# Patient Record
Sex: Female | Born: 1960
Health system: Southern US, Community
[De-identification: ages and names within clinical notes are randomized; demographics above are authoritative.]

## PROBLEM LIST (undated history)

## (undated) DIAGNOSIS — F329 Major depressive disorder, single episode, unspecified: Secondary | ICD-10-CM

## (undated) DIAGNOSIS — F32A Depression, unspecified: Secondary | ICD-10-CM

## (undated) DIAGNOSIS — F419 Anxiety disorder, unspecified: Secondary | ICD-10-CM

## (undated) DIAGNOSIS — E785 Hyperlipidemia, unspecified: Secondary | ICD-10-CM

## (undated) DIAGNOSIS — I10 Essential (primary) hypertension: Secondary | ICD-10-CM

## (undated) HISTORY — DX: Essential (primary) hypertension: I10

## (undated) HISTORY — DX: Major depressive disorder, single episode, unspecified: F32.9

## (undated) HISTORY — DX: Anxiety disorder, unspecified: F41.9

## (undated) HISTORY — DX: Depression, unspecified: F32.A

## (undated) HISTORY — DX: Hyperlipidemia, unspecified: E78.5

---

## 2009-12-25 ENCOUNTER — Ambulatory Visit: Payer: Self-pay | Admitting: Otolaryngology

## 2011-04-22 IMAGING — CT CT MAXILLOFACIAL WITHOUT CONTRAST
1 series · 15 of 30 positions shown, 19 images · non-contrast
Comparison: none

REASON FOR EXAM: left nasal mass maxillary sinusitis
COMMENTS:

[Series 3: (person_name) 1mm · axial · 0.31mm/px · z∈[-116,+11]mm · 15 of 137 slices shown, 19 images]
[im 5/137  brain]
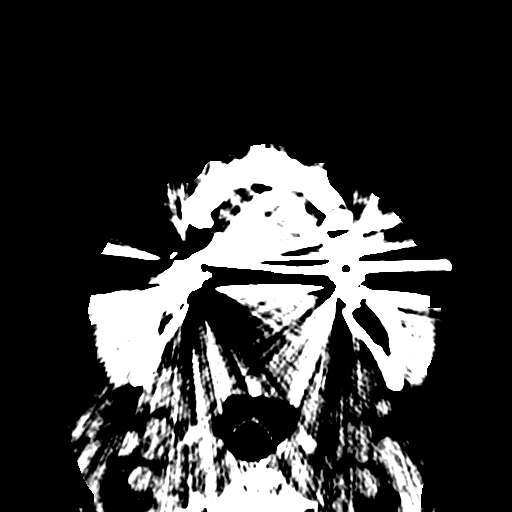
[im 5/137  bone]
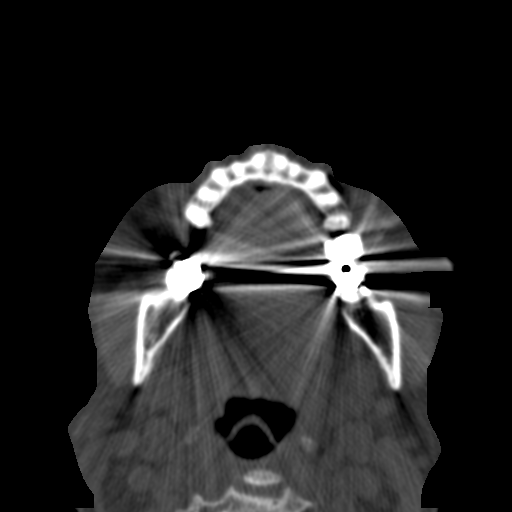
[im 15/137  bone]
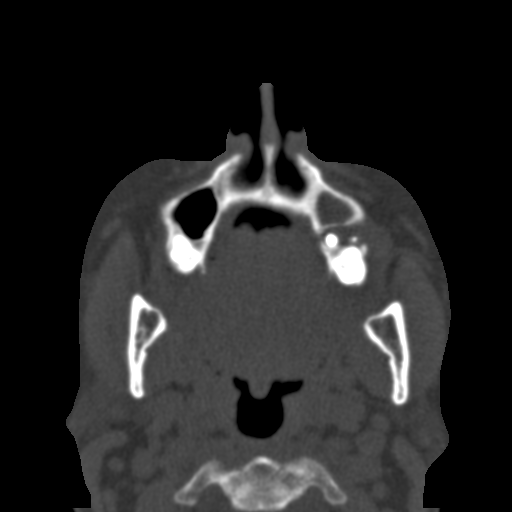
[im 24/137  bone]
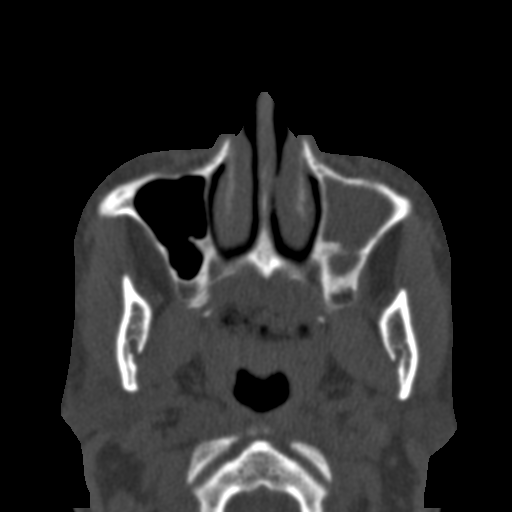
[im 33/137  bone]
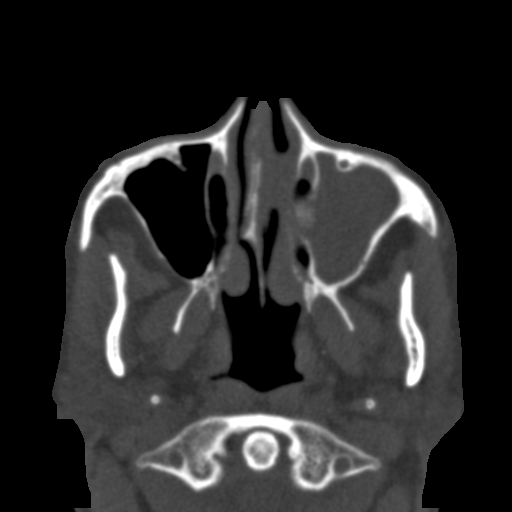
[im 43/137  brain]
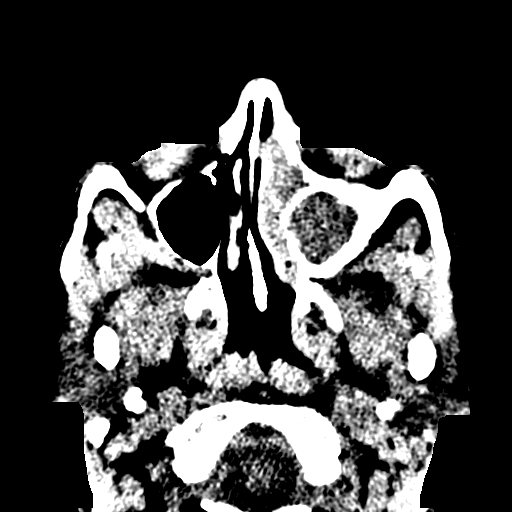
[im 43/137  bone]
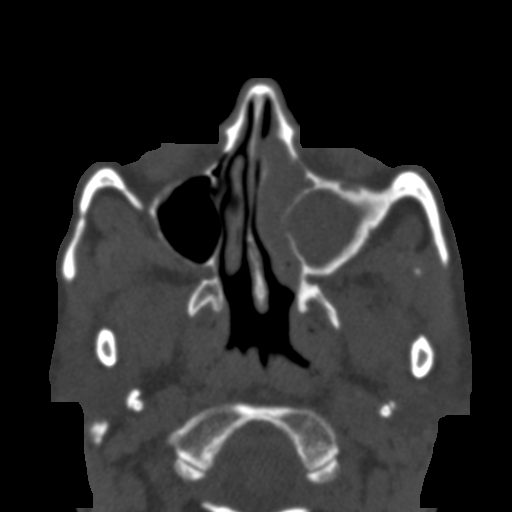
[im 52/137  bone]
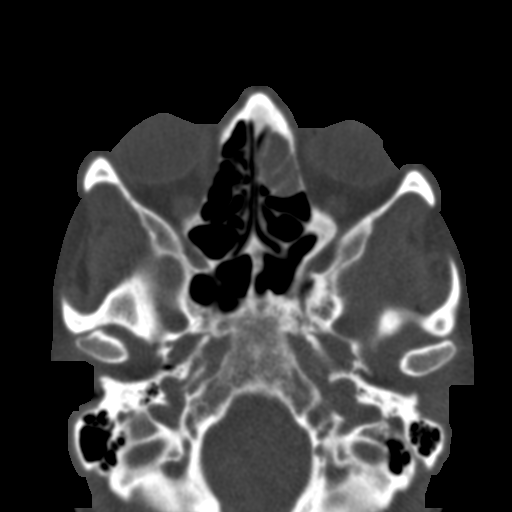
[im 61/137  bone]
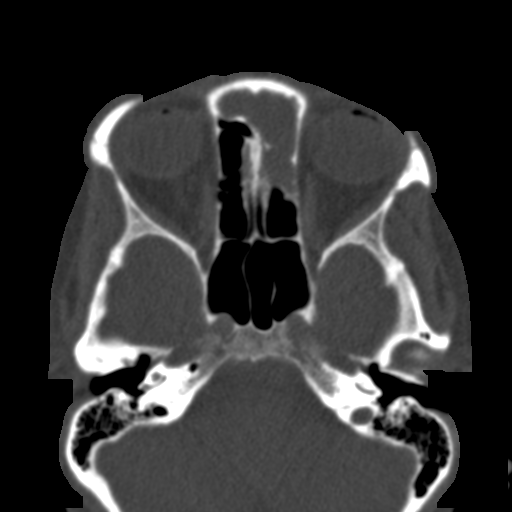
[im 71/137  bone]
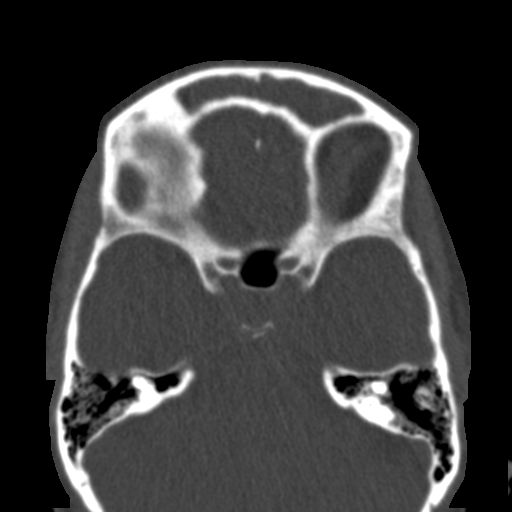
[im 76/137  brain]
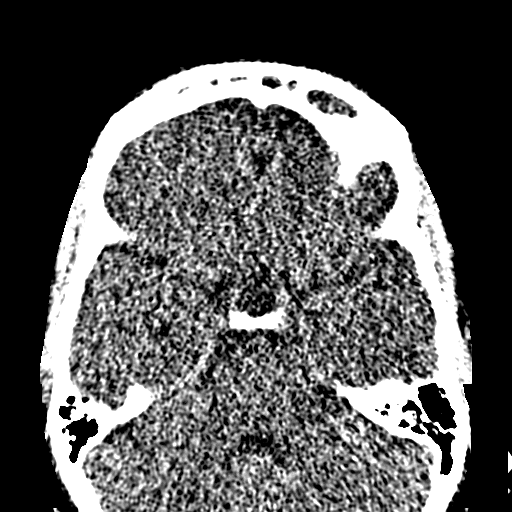
[im 76/137  bone]
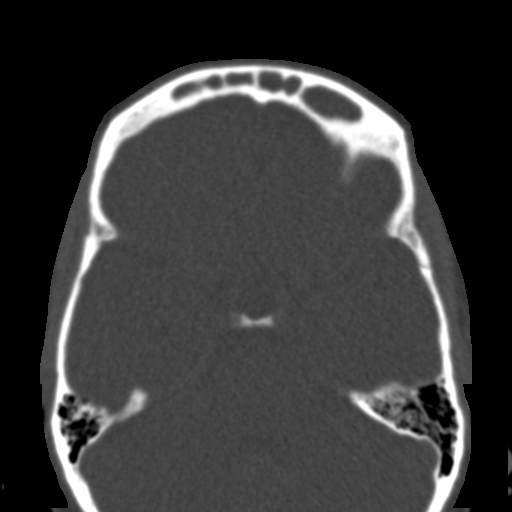
[im 85/137  bone]
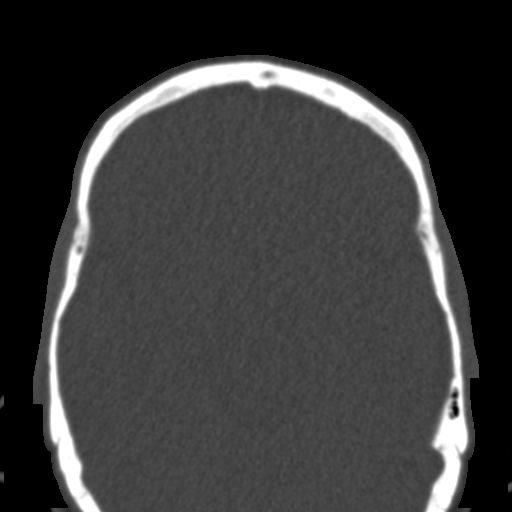
[im 94/137  bone]
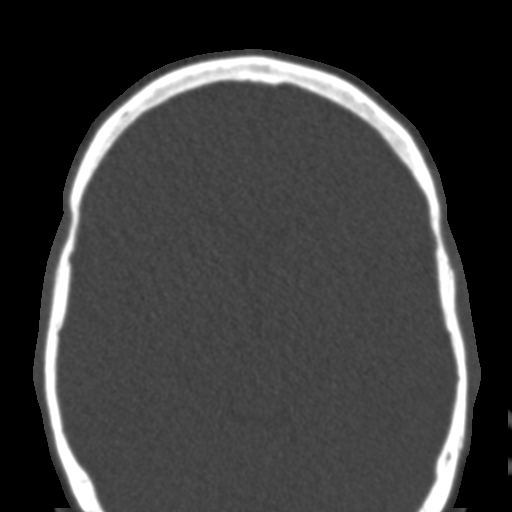
[im 104/137  bone]
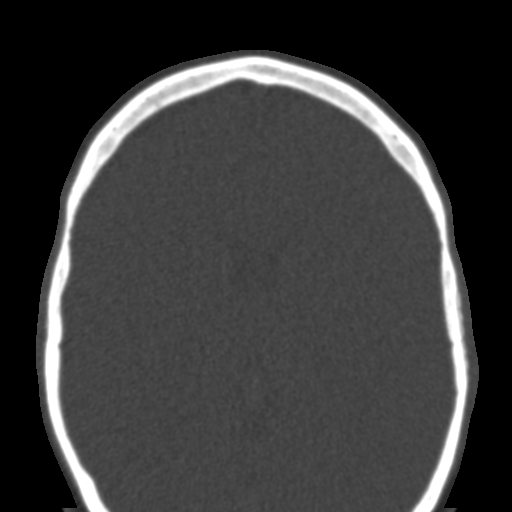
[im 113/137  brain]
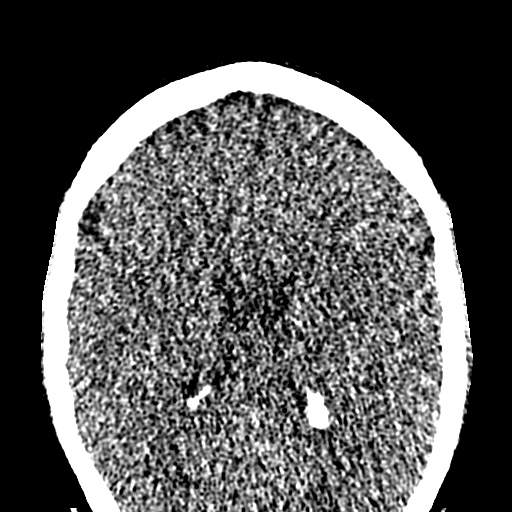
[im 113/137  bone]
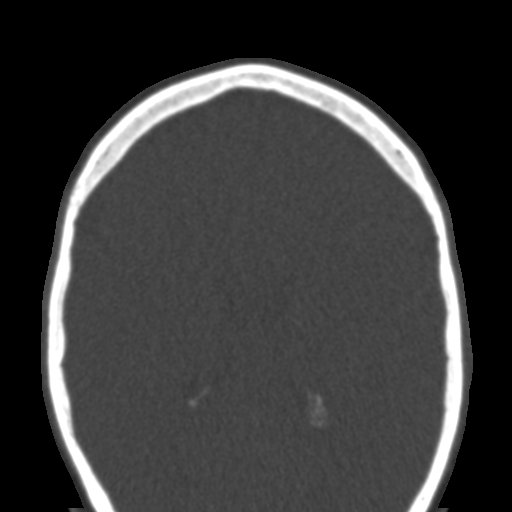
[im 122/137  bone]
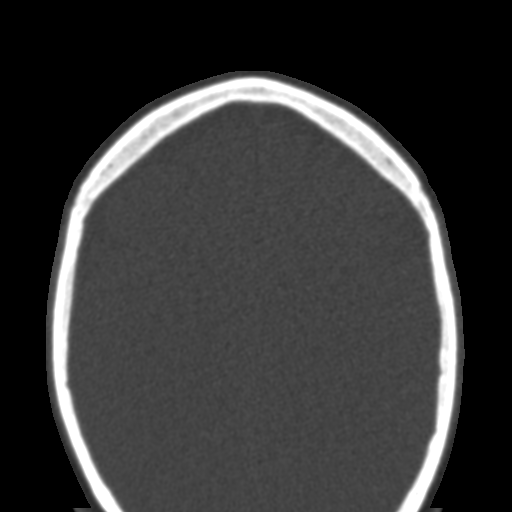
[im 132/137  bone]
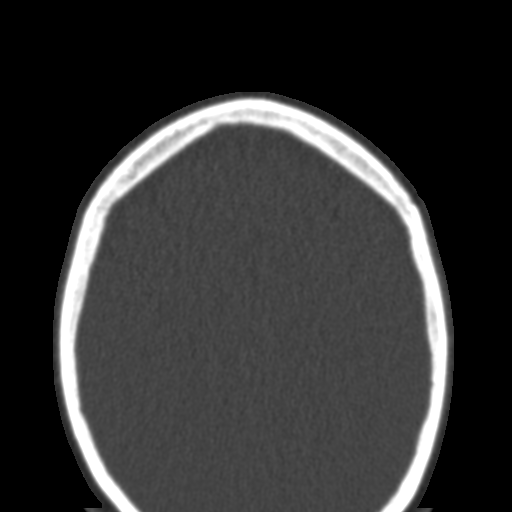

[15 of 30 positions shown; findings below may reference images not displayed]

PROCEDURE:     KCT - KCT MAXILLOFACIAL(SKOLJKE)  - December 25, 2009  [DATE]

RESULT:     Axial images were obtained through the paranasal sinuses from
the maxillary region to the anterior cranial fossa. A bone algorithm was
employed. Coronal reconstructions were obtained.

The frontal sinuses are largely opacified. There is opacification of
portions of the anterior left ethmoid sinus cells. The right ethmoid sinus
cells appear clear. The right maxillary sinus is opacified. Expansile
remodeling of the medial wall of the right maxillary sinus is seen with
displacement of the turbinate bones medially toward the nasal septum. The
nasal septum itself is mildly deviated toward the right. The sphenoid
sinuses are clear. The ostiomeatal unit of the right maxillary sinus is
patent but it is expanded and filled with soft tissue density material on
the left.
IMPRESSION: There is abnormal soft tissue density throughout the left
maxillary sinus, through the anterior and mid portions of the left ethmoid
sinus, and in the frontal sinuses with only minimal sparing of a small
portion of the right frontal sinus. The sphenoid sinuses are clear.

## 2014-07-26 DIAGNOSIS — N184 Chronic kidney disease, stage 4 (severe): Secondary | ICD-10-CM | POA: Insufficient documentation

## 2014-07-26 DIAGNOSIS — I1310 Hypertensive heart and chronic kidney disease without heart failure, with stage 1 through stage 4 chronic kidney disease, or unspecified chronic kidney disease: Secondary | ICD-10-CM | POA: Insufficient documentation

## 2014-07-26 DIAGNOSIS — I129 Hypertensive chronic kidney disease with stage 1 through stage 4 chronic kidney disease, or unspecified chronic kidney disease: Secondary | ICD-10-CM

## 2014-07-26 DIAGNOSIS — E785 Hyperlipidemia, unspecified: Secondary | ICD-10-CM

## 2014-07-26 DIAGNOSIS — N183 Chronic kidney disease, stage 3 unspecified: Secondary | ICD-10-CM

## 2014-07-26 DIAGNOSIS — I10 Essential (primary) hypertension: Secondary | ICD-10-CM

## 2014-09-07 DIAGNOSIS — F418 Other specified anxiety disorders: Secondary | ICD-10-CM | POA: Insufficient documentation

## 2014-09-13 ENCOUNTER — Encounter: Payer: Self-pay | Admitting: Unknown Physician Specialty

## 2014-09-13 ENCOUNTER — Ambulatory Visit (INDEPENDENT_AMBULATORY_CARE_PROVIDER_SITE_OTHER): Payer: 59 | Admitting: Unknown Physician Specialty

## 2014-09-13 VITALS — BP 143/84 | HR 52 | Ht 63.0 in | Wt 163.8 lb

## 2014-09-13 DIAGNOSIS — N184 Chronic kidney disease, stage 4 (severe): Secondary | ICD-10-CM | POA: Diagnosis not present

## 2014-09-13 DIAGNOSIS — I129 Hypertensive chronic kidney disease with stage 1 through stage 4 chronic kidney disease, or unspecified chronic kidney disease: Secondary | ICD-10-CM | POA: Diagnosis not present

## 2014-09-13 DIAGNOSIS — N181 Chronic kidney disease, stage 1: Secondary | ICD-10-CM | POA: Diagnosis not present

## 2014-09-13 DIAGNOSIS — N182 Chronic kidney disease, stage 2 (mild): Secondary | ICD-10-CM | POA: Diagnosis not present

## 2014-09-13 DIAGNOSIS — N189 Chronic kidney disease, unspecified: Secondary | ICD-10-CM | POA: Diagnosis not present

## 2014-09-13 DIAGNOSIS — N183 Chronic kidney disease, stage 3 unspecified: Secondary | ICD-10-CM

## 2014-09-13 DIAGNOSIS — N185 Chronic kidney disease, stage 5: Secondary | ICD-10-CM | POA: Diagnosis not present

## 2014-09-13 DIAGNOSIS — E785 Hyperlipidemia, unspecified: Secondary | ICD-10-CM

## 2014-09-13 LAB — HM COLONOSCOPY

## 2014-09-13 MED ORDER — METOPROLOL TARTRATE 100 MG PO TABS: 100.0000 mg | ORAL_TABLET | Freq: Two times a day (BID) | ORAL | Status: DC

## 2014-09-13 MED ORDER — NIFEDIPINE ER 90 MG PO TB24: 90.0000 mg | ORAL_TABLET | Freq: Every day | ORAL | Status: DC

## 2014-09-13 NOTE — Progress Notes (Signed)
BP 143/84 mmHg  Pulse 52  Ht 5\' 3"  (1.6 m)  Wt 163 lb 12.8 oz (74.299 kg)  BMI 29.02 kg/m2  SpO2 99%  LMP  (LMP Unknown)   Subjective:    Patient ID: Emma Sullivan, female    DOB: Aug 21, 1960, 54 y.o.   MRN: [id number]  HPI: Emma Sullivan is a 54 y.o. female  Chief Complaint  Patient presents with  . Hypertension    Relevant past medical, surgical, family and social history reviewed and updated as indicated. Interim medical history since our last visit reviewed. Allergies and medications reviewed and updated.  Hypertension This is a chronic problem. The problem is unchanged. The problem is controlled. Pertinent negatives include no anxiety, blurred vision, chest pain, headaches, malaise/fatigue, neck pain, orthopnea, palpitations, peripheral edema, PND or shortness of breath. There are no compliance problems.  Hypertensive end-organ damage includes kidney disease. Identifiable causes of hypertension include chronic renal disease. Sees Nephrology.  Hyperlipidemia This is a chronic problem. The problem is controlled. Exacerbating diseases include chronic renal disease. Pertinent negatives include no chest pain or shortness of breath. Current antihyperlipidemic treatment includes statins. There are no compliance problems.  Risk factors for coronary artery disease include hypertension.     Review of Systems  Constitutional: Negative for malaise/fatigue.  Eyes: Negative for blurred vision.  Respiratory: Negative for shortness of breath.   Cardiovascular: Negative for chest pain, palpitations, orthopnea and PND.  Musculoskeletal: Negative for neck pain.  Neurological: Negative for headaches.    Per HPI unless specifically indicated above     Objective:    BP 143/84 mmHg  Pulse 52  Ht 5\' 3"  (1.6 m)  Wt 163 lb 12.8 oz (74.299 kg)  BMI 29.02 kg/m2  SpO2 99%  LMP  (LMP Unknown)  Wt Readings from Last 3 Encounters:  09/13/14 163 lb 12.8 oz (74.299 kg)  03/11/14 162 lb  (73.483 kg)    Physical Exam  Constitutional: She is oriented to person, place, and time. She appears well-developed and well-nourished. No distress.  HENT:  Head: Normocephalic and atraumatic.  Eyes: Conjunctivae and lids are normal. Right eye exhibits no discharge. Left eye exhibits no discharge. No scleral icterus.  Cardiovascular: Normal rate and regular rhythm.   Pulmonary/Chest: Effort normal and breath sounds normal. No respiratory distress.  Abdominal: Normal appearance. There is no splenomegaly or hepatomegaly.  Musculoskeletal: Normal range of motion.  Neurological: She is alert and oriented to person, place, and time.  Skin: Skin is intact. No rash noted. No pallor.  Psychiatric: She has a normal mood and affect. Her behavior is normal. Judgment and thought content normal.    Results for orders placed or performed in visit on 09/13/14  HM COLONOSCOPY  Result Value Ref Range   HM Colonoscopy refused       Assessment & Plan:   Problem List Items Addressed This Visit      Genitourinary   Hypertensive nephropathy    A little high today.  However, it has been stable.  Will recheck in 6 months      Relevant Orders   Comprehensive metabolic panel   Chronic kidney disease, stage III (moderate) - Primary    Per Nephrology      Relevant Orders   Comprehensive metabolic panel     Other   Hyperlipemia    Check Lipid panel today      Relevant Medications   metoprolol (LOPRESSOR) 100 MG tablet   NIFEdipine (ADALAT CC) 90 MG 24 hr  tablet   Other Relevant Orders   Comprehensive metabolic panel   Lipid Panel w/o Chol/HDL Ratio       Follow up plan: Return in about 6 months (around 03/15/2015) for PHYSICAL.

## 2014-09-13 NOTE — Assessment & Plan Note (Signed)
A little high today.  However, it has been stable.  Will recheck in 6 months

## 2014-09-13 NOTE — Assessment & Plan Note (Signed)
Per Nephrology.

## 2014-09-13 NOTE — Assessment & Plan Note (Signed)
Check Lipid panel today 

## 2014-09-14 LAB — COMPREHENSIVE METABOLIC PANEL
ALBUMIN: 4.4 g/dL (ref 3.5–5.5)
ALT: 13 IU/L (ref 0–32)
AST: 20 IU/L (ref 0–40)
Albumin/Globulin Ratio: 2.4 (ref 1.1–2.5)
Alkaline Phosphatase: 106 IU/L (ref 39–117)
BUN / CREAT RATIO: 19 (ref 9–23)
BUN: 36 mg/dL — AB (ref 6–24)
Bilirubin Total: 0.5 mg/dL (ref 0.0–1.2)
CALCIUM: 9.7 mg/dL (ref 8.7–10.2)
CHLORIDE: 106 mmol/L (ref 97–108)
CO2: 20 mmol/L (ref 18–29)
CREATININE: 1.9 mg/dL — AB (ref 0.57–1.00)
GFR calc Af Amer: 33 mL/min/{1.73_m2} — ABNORMAL LOW (ref >59)
GFR calc non Af Amer: 29 mL/min/{1.73_m2} — ABNORMAL LOW (ref >59)
GLUCOSE: 91 mg/dL (ref 65–99)
Globulin, Total: 1.8 g/dL (ref 1.5–4.5)
Potassium: 4.8 mmol/L (ref 3.5–5.2)
Sodium: 141 mmol/L (ref 134–144)
Total Protein: 6.2 g/dL (ref 6.0–8.5)

## 2014-09-14 LAB — LIPID PANEL W/O CHOL/HDL RATIO
CHOLESTEROL TOTAL: 157 mg/dL (ref 100–199)
HDL: 49 mg/dL (ref >39)
LDL Calculated: 96 mg/dL (ref 0–99)
Triglycerides: 62 mg/dL (ref 0–149)
VLDL Cholesterol Cal: 12 mg/dL (ref 5–40)

## 2014-11-04 ENCOUNTER — Telehealth: Payer: Self-pay | Admitting: Unknown Physician Specialty

## 2014-11-04 DIAGNOSIS — N183 Chronic kidney disease, stage 3 unspecified: Secondary | ICD-10-CM

## 2014-11-04 NOTE — Telephone Encounter (Signed)
Pt came in and wanted to know about getting another referral to go to [facility] to see dr Johnney Ou. Says she'd been going for a while but has changed insurances and now needs a new referral. Pt would like a call back at [phone]

## 2014-11-04 NOTE — Telephone Encounter (Signed)
Routing to provider  

## 2014-11-08 NOTE — Telephone Encounter (Signed)
Is this for Nephrology?

## 2014-11-08 NOTE — Telephone Encounter (Signed)
Yes it is for nephrology at [facility].

## 2015-03-02 ENCOUNTER — Encounter: Payer: Self-pay | Admitting: Unknown Physician Specialty

## 2015-03-14 ENCOUNTER — Encounter: Payer: 59 | Admitting: Unknown Physician Specialty

## 2015-04-25 ENCOUNTER — Ambulatory Visit (INDEPENDENT_AMBULATORY_CARE_PROVIDER_SITE_OTHER): Payer: BLUE CROSS/BLUE SHIELD | Admitting: Unknown Physician Specialty

## 2015-04-25 ENCOUNTER — Encounter: Payer: Self-pay | Admitting: Unknown Physician Specialty

## 2015-04-25 VITALS — BP 137/83 | HR 52 | Temp 98.5°F | Ht 61.3 in | Wt 159.2 lb

## 2015-04-25 DIAGNOSIS — N183 Chronic kidney disease, stage 3 unspecified: Secondary | ICD-10-CM

## 2015-04-25 DIAGNOSIS — I10 Essential (primary) hypertension: Secondary | ICD-10-CM | POA: Diagnosis not present

## 2015-04-25 DIAGNOSIS — Z0000 Encounter for general adult medical examination without abnormal findings: Secondary | ICD-10-CM

## 2015-04-25 NOTE — Progress Notes (Signed)
BP 137/83 mmHg  Pulse 52  Temp(Src) 98.5 F (36.9 C)  Ht 5' 1.3" (1.557 m)  Wt 159 lb 3.2 oz (72.213 kg)  BMI 29.79 kg/m2  SpO2 98%  LMP  (LMP Unknown)   Subjective:    Patient ID: Emma Sullivan, female    DOB: 11-24-1960, 55 y.o.   MRN: [id number]  HPI: Emma Sullivan is a 55 y.o. female  Chief Complaint  Patient presents with  . Annual Exam   Hypertension Using medications without difficulty Average home BPs: Not checking  No problems or lightheadedness No chest pain with exertion or shortness of breath No Edema    Relevant past medical, surgical, family and social history reviewed and updated as indicated. Interim medical history since our last visit reviewed. Allergies and medications reviewed and updated.  Review of Systems  Constitutional: Negative.   HENT: Negative.   Eyes: Negative.   Respiratory: Negative.   Cardiovascular: Negative.   Gastrointestinal: Negative.   Endocrine: Negative.   Genitourinary: Negative.   Musculoskeletal: Negative.   Skin: Negative.   Allergic/Immunologic: Negative.   Neurological: Negative.   Hematological: Negative.   Psychiatric/Behavioral: Negative.     Per HPI unless specifically indicated above     Objective:    BP 137/83 mmHg  Pulse 52  Temp(Src) 98.5 F (36.9 C)  Ht 5' 1.3" (1.557 m)  Wt 159 lb 3.2 oz (72.213 kg)  BMI 29.79 kg/m2  SpO2 98%  LMP  (LMP Unknown)  Wt Readings from Last 3 Encounters:  04/25/15 159 lb 3.2 oz (72.213 kg)  09/13/14 163 lb 12.8 oz (74.299 kg)  03/11/14 162 lb (73.483 kg)    Physical Exam  Constitutional: She is oriented to person, place, and time. She appears well-developed and well-nourished.  HENT:  Head: Normocephalic and atraumatic.  Eyes: Pupils are equal, round, and reactive to light. Right eye exhibits no discharge. Left eye exhibits no discharge. No scleral icterus.  Neck: Normal range of motion. Neck supple. Carotid bruit is not present. No thyromegaly present.   Cardiovascular: Normal rate, regular rhythm and normal heart sounds.  Exam reveals no gallop and no friction rub.   No murmur heard. Pulmonary/Chest: Effort normal and breath sounds normal. No respiratory distress. She has no wheezes. She has no rales.  Abdominal: Soft. Bowel sounds are normal. There is no tenderness. There is no rebound.  Genitourinary: No breast swelling, tenderness or discharge.  Musculoskeletal: Normal range of motion.  Lymphadenopathy:    She has no cervical adenopathy.  Neurological: She is alert and oriented to person, place, and time.  Skin: Skin is warm, dry and intact. No rash noted.  Psychiatric: She has a normal mood and affect. Her speech is normal and behavior is normal. Judgment and thought content normal. Cognition and memory are normal.    Results for orders placed or performed in visit on 09/13/14  Comprehensive metabolic panel  Result Value Ref Range   Glucose 91 65 - 99 mg/dL   BUN 36 (H) 6 - 24 mg/dL   Creatinine, Ser 1.90 (H) 0.57 - 1.00 mg/dL   GFR calc non Af Amer 29 (L) >59 mL/min/1.73   GFR calc Af Amer 33 (L) >59 mL/min/1.73   BUN/Creatinine Ratio 19 9 - 23   Sodium 141 134 - 144 mmol/L   Potassium 4.8 3.5 - 5.2 mmol/L   Chloride 106 97 - 108 mmol/L   CO2 20 18 - 29 mmol/L   Calcium 9.7 8.7 - 10.2 mg/dL  Total Protein 6.2 6.0 - 8.5 g/dL   Albumin 4.4 3.5 - 5.5 g/dL   Globulin, Total 1.8 1.5 - 4.5 g/dL   Albumin/Globulin Ratio 2.4 1.1 - 2.5   Bilirubin Total 0.5 0.0 - 1.2 mg/dL   Alkaline Phosphatase 106 39 - 117 IU/L   AST 20 0 - 40 IU/L   ALT 13 0 - 32 IU/L  Lipid Panel w/o Chol/HDL Ratio  Result Value Ref Range   Cholesterol, Total 157 100 - 199 mg/dL   Triglycerides 62 0 - 149 mg/dL   HDL 49 >39 mg/dL   VLDL Cholesterol Cal 12 5 - 40 mg/dL   LDL Calculated 96 0 - 99 mg/dL  HM COLONOSCOPY  Result Value Ref Range   HM Colonoscopy refused    CMP and CBC done at nephrology and are stable    Assessment & Plan:   Problem  List Items Addressed This Visit      Unprioritized   Essential hypertension, benign    Stable, continue present medications.        Relevant Medications   metoprolol (LOPRESSOR) 100 MG tablet   Chronic kidney disease, stage III (moderate) - Primary    Per Nephrology      Relevant Orders   VITAMIN D 25 Hydroxy (Vit-D Deficiency, Fractures)    Other Visit Diagnoses    Routine general medical examination at a health care facility        Relevant Orders    TSH    HIV antibody    Hepatitis C antibody        Follow up plan: Return in about 6 months (around 10/23/2015).

## 2015-04-25 NOTE — Assessment & Plan Note (Signed)
Per Nephrology.

## 2015-04-25 NOTE — Assessment & Plan Note (Signed)
Stable, continue present medications.   

## 2015-04-26 ENCOUNTER — Encounter: Payer: Self-pay | Admitting: Unknown Physician Specialty

## 2015-04-26 LAB — TSH: TSH: 2.65 u[IU]/mL (ref 0.450–4.500)

## 2015-04-26 LAB — HIV ANTIBODY (ROUTINE TESTING W REFLEX): HIV SCREEN 4TH GENERATION: NONREACTIVE

## 2015-04-26 LAB — HEPATITIS C ANTIBODY: Hep C Virus Ab: <0.1 s/co ratio (ref 0.0–0.9)

## 2015-04-26 LAB — VITAMIN D 25 HYDROXY (VIT D DEFICIENCY, FRACTURES): Vit D, 25-Hydroxy: 31.5 ng/mL (ref 30.0–100.0)

## 2015-05-08 ENCOUNTER — Other Ambulatory Visit: Payer: Self-pay | Admitting: Unknown Physician Specialty

## 2015-09-18 ENCOUNTER — Other Ambulatory Visit: Payer: Self-pay | Admitting: Unknown Physician Specialty

## 2015-10-24 ENCOUNTER — Ambulatory Visit (INDEPENDENT_AMBULATORY_CARE_PROVIDER_SITE_OTHER): Payer: BLUE CROSS/BLUE SHIELD | Admitting: Unknown Physician Specialty

## 2015-10-24 ENCOUNTER — Encounter: Payer: Self-pay | Admitting: Unknown Physician Specialty

## 2015-10-24 VITALS — BP 114/74 | HR 66 | Temp 98.4°F | Ht 63.0 in | Wt 160.0 lb

## 2015-10-24 DIAGNOSIS — N183 Chronic kidney disease, stage 3 unspecified: Secondary | ICD-10-CM

## 2015-10-24 DIAGNOSIS — I10 Essential (primary) hypertension: Secondary | ICD-10-CM

## 2015-10-24 DIAGNOSIS — E785 Hyperlipidemia, unspecified: Secondary | ICD-10-CM

## 2015-10-24 MED ORDER — NIFEDIPINE ER OSMOTIC RELEASE 90 MG PO TB24: 90.0000 mg | ORAL_TABLET | Freq: Every day | ORAL | 1 refills | Status: DC

## 2015-10-24 MED ORDER — METOPROLOL TARTRATE 100 MG PO TABS: 100.0000 mg | ORAL_TABLET | Freq: Two times a day (BID) | ORAL | 1 refills | Status: DC

## 2015-10-24 MED ORDER — PRAVASTATIN SODIUM 10 MG PO TABS: 10.0000 mg | ORAL_TABLET | Freq: Every day | ORAL | 1 refills | Status: DC

## 2015-10-24 NOTE — Progress Notes (Signed)
BP 114/74 (BP Location: Left Arm, Patient Position: Sitting, Cuff Size: Large)   Pulse 66   Temp 98.4 F (36.9 C)   Ht 5\' 3"  (1.6 m)   Wt 160 lb (72.6 kg)   LMP  (LMP Unknown)   SpO2 98%   BMI 28.34 kg/m    Subjective:    Patient ID: Emma Sullivan, female    DOB: 1960/06/08, 55 y.o.   MRN: [id number]  HPI: Emma Sullivan is a 55 y.o. female  Chief Complaint  Patient presents with  . Hyperlipidemia  . Hypertension   Hypertension Using medications without difficulty Average home BPs not checking   No problems or lightheadedness No chest pain with exertion or shortness of breath No Edema   Hyperlipidemia Using medications without problems: No Muscle aches  Diet compliance: Eats well Exercise: Plenty of exercise  CKD Through Nephrology  Relevant past medical, surgical, family and social history reviewed and updated as indicated. Interim medical history since our last visit reviewed. Allergies and medications reviewed and updated.  Review of Systems  Per HPI unless specifically indicated above     Objective:    BP 114/74 (BP Location: Left Arm, Patient Position: Sitting, Cuff Size: Large)   Pulse 66   Temp 98.4 F (36.9 C)   Ht 5\' 3"  (1.6 m)   Wt 160 lb (72.6 kg)   LMP  (LMP Unknown)   SpO2 98%   BMI 28.34 kg/m   Wt Readings from Last 3 Encounters:  10/24/15 160 lb (72.6 kg)  04/25/15 159 lb 3.2 oz (72.2 kg)  09/13/14 163 lb 12.8 oz (74.3 kg)    Physical Exam  Constitutional: She is oriented to person, place, and time. She appears well-developed and well-nourished. No distress.  HENT:  Head: Normocephalic and atraumatic.  Eyes: Conjunctivae and lids are normal. Right eye exhibits no discharge. Left eye exhibits no discharge. No scleral icterus.  Neck: Normal range of motion. Neck supple. No JVD present. Carotid bruit is not present.  Cardiovascular: Normal rate, regular rhythm and normal heart sounds.   Pulmonary/Chest: Effort normal and breath  sounds normal.  Abdominal: Normal appearance. There is no splenomegaly or hepatomegaly.  Musculoskeletal: Normal range of motion.  Neurological: She is alert and oriented to person, place, and time.  Skin: Skin is warm, dry and intact. No rash noted. No pallor.  Psychiatric: She has a normal mood and affect. Her behavior is normal. Judgment and thought content normal.    Results for orders placed or performed in visit on 04/25/15  TSH  Result Value Ref Range   TSH 2.650 0.450 - 4.500 uIU/mL  HIV antibody  Result Value Ref Range   HIV Screen 4th Generation wRfx Non Reactive Non Reactive  Hepatitis C antibody  Result Value Ref Range   Hep C Virus Ab <0.1 0.0 - 0.9 s/co ratio  VITAMIN D 25 Hydroxy (Vit-D Deficiency, Fractures)  Result Value Ref Range   Vit D, 25-Hydroxy 31.5 30.0 - 100.0 ng/mL      Assessment & Plan:   Problem List Items Addressed This Visit      Unprioritized   Chronic kidney disease, stage III (moderate)   Essential hypertension, benign - Primary   Relevant Medications   pravastatin (PRAVACHOL) 10 MG tablet   NIFEdipine (PROCARDIA XL/ADALAT-CC) 90 MG 24 hr tablet   metoprolol (LOPRESSOR) 100 MG tablet   Other Relevant Orders   Comprehensive metabolic panel   Hyperlipemia   Relevant Medications   pravastatin (PRAVACHOL)  10 MG tablet   NIFEdipine (PROCARDIA XL/ADALAT-CC) 90 MG 24 hr tablet   metoprolol (LOPRESSOR) 100 MG tablet   Other Relevant Orders   Lipid Panel w/o Chol/HDL Ratio    Other Visit Diagnoses   None.      Follow up plan: Return in about 6 months (around 04/25/2016) for physical.

## 2015-10-25 ENCOUNTER — Encounter: Payer: Self-pay | Admitting: Unknown Physician Specialty

## 2015-10-25 LAB — COMPREHENSIVE METABOLIC PANEL
ALK PHOS: 108 IU/L (ref 39–117)
ALT: 13 IU/L (ref 0–32)
AST: 22 IU/L (ref 0–40)
Albumin/Globulin Ratio: 1.6 (ref 1.2–2.2)
Albumin: 4.2 g/dL (ref 3.5–5.5)
BILIRUBIN TOTAL: 0.4 mg/dL (ref 0.0–1.2)
BUN/Creatinine Ratio: 16 (ref 9–23)
BUN: 34 mg/dL — AB (ref 6–24)
CHLORIDE: 105 mmol/L (ref 96–106)
CO2: 23 mmol/L (ref 18–29)
Calcium: 9.5 mg/dL (ref 8.7–10.2)
Creatinine, Ser: 2.12 mg/dL — ABNORMAL HIGH (ref 0.57–1.00)
GFR calc Af Amer: 29 mL/min/{1.73_m2} — ABNORMAL LOW (ref >59)
GFR calc non Af Amer: 25 mL/min/{1.73_m2} — ABNORMAL LOW (ref >59)
GLUCOSE: 84 mg/dL (ref 65–99)
Globulin, Total: 2.6 g/dL (ref 1.5–4.5)
Potassium: 4.6 mmol/L (ref 3.5–5.2)
Sodium: 142 mmol/L (ref 134–144)
Total Protein: 6.8 g/dL (ref 6.0–8.5)

## 2015-10-25 LAB — LIPID PANEL W/O CHOL/HDL RATIO
CHOLESTEROL TOTAL: 181 mg/dL (ref 100–199)
HDL: 50 mg/dL (ref >39)
LDL Calculated: 108 mg/dL — ABNORMAL HIGH (ref 0–99)
TRIGLYCERIDES: 113 mg/dL (ref 0–149)
VLDL CHOLESTEROL CAL: 23 mg/dL (ref 5–40)

## 2016-04-30 ENCOUNTER — Encounter: Payer: BLUE CROSS/BLUE SHIELD | Admitting: Unknown Physician Specialty

## 2016-05-15 ENCOUNTER — Encounter: Payer: Self-pay | Admitting: Unknown Physician Specialty

## 2016-05-15 ENCOUNTER — Ambulatory Visit (INDEPENDENT_AMBULATORY_CARE_PROVIDER_SITE_OTHER): Payer: BLUE CROSS/BLUE SHIELD | Admitting: Unknown Physician Specialty

## 2016-05-15 VITALS — BP 118/75 | HR 67 | Temp 98.7°F | Ht 61.2 in | Wt 164.1 lb

## 2016-05-15 DIAGNOSIS — E7800 Pure hypercholesterolemia, unspecified: Secondary | ICD-10-CM | POA: Diagnosis not present

## 2016-05-15 DIAGNOSIS — N184 Chronic kidney disease, stage 4 (severe): Secondary | ICD-10-CM

## 2016-05-15 DIAGNOSIS — I10 Essential (primary) hypertension: Secondary | ICD-10-CM | POA: Diagnosis not present

## 2016-05-15 DIAGNOSIS — Z0000 Encounter for general adult medical examination without abnormal findings: Secondary | ICD-10-CM

## 2016-05-15 MED ORDER — PRAVASTATIN SODIUM 10 MG PO TABS: 10.0000 mg | ORAL_TABLET | Freq: Every day | ORAL | 1 refills | Status: DC

## 2016-05-15 MED ORDER — METOPROLOL TARTRATE 100 MG PO TABS: 100.0000 mg | ORAL_TABLET | Freq: Two times a day (BID) | ORAL | 1 refills | Status: DC

## 2016-05-15 MED ORDER — NIFEDIPINE ER OSMOTIC RELEASE 90 MG PO TB24: 90.0000 mg | ORAL_TABLET | Freq: Every day | ORAL | 1 refills | Status: DC

## 2016-05-15 NOTE — Progress Notes (Signed)
BP 118/75 (BP Location: Left Arm, Patient Position: Sitting, Cuff Size: Normal)   Pulse 67   Temp 98.7 F (37.1 C)   Ht 5' 1.2" (1.554 m)   Wt 164 lb 1.6 oz (74.4 kg)   LMP  (LMP Unknown)   SpO2 97%   BMI 30.80 kg/m    Subjective:    Patient ID: Emma Sullivan, female    DOB: 06-29-1960, 56 y.o.   MRN: [id number]  HPI: Emma Sullivan is a 56 y.o. female  Chief Complaint  Patient presents with  . Annual Exam   Not answering Medicare exam questions.    Hypertension Using medications without difficulty Average home BP Not checking  No problems or lightheadedness No chest pain with exertion or shortness of breath No Edema   Hyperlipidemia Using medications without problems: No Muscle aches  Diet compliance: varies Exercise: Works at  [employer]  . Marital status: Married    Spouse name: N/A  . Number of children: N/A  . Years of education: N/A   Occupational History  . Not on file.   Social History Main Topics  . Smoking status: Never Smoker  . Smokeless tobacco: Never Used  . Alcohol use No  . Drug use: No  . Sexual activity: Yes   Other Topics Concern  . Not on file   Social History Narrative  . No narrative on file   Family History  Problem Relation Age of Onset  . Diabetes Mother   . Hypertension Mother   . Heart disease Father   . Hypertension Daughter    Past Medical History:  Diagnosis Date  . Anxiety   . Depression   . Hyperlipidemia   . Hypertension    Past Surgical History:  Procedure Laterality Date  . CESAREAN SECTION       Relevant past medical, surgical, family and social history reviewed and updated as indicated. Interim medical history since our last visit reviewed. Allergies and medications reviewed and updated.  Review of Systems  Constitutional: Negative.   HENT: Negative.   Eyes: Negative.   Respiratory: Negative.   Cardiovascular: Negative.   Gastrointestinal: Negative.     Endocrine: Negative.   Genitourinary: Negative.   Musculoskeletal: Negative.   Skin: Negative.   Allergic/Immunologic: Negative.   Neurological: Negative.   Hematological: Negative.   Psychiatric/Behavioral: Negative.     Per HPI unless specifically indicated above     Objective:    BP 118/75 (BP Location: Left Arm, Patient Position: Sitting, Cuff Size: Normal)   Pulse 67   Temp 98.7 F (37.1 C)   Ht 5' 1.2" (1.554 m)   Wt 164 lb 1.6 oz (74.4 kg)   LMP  (LMP Unknown)   SpO2 97%   BMI 30.80 kg/m   Wt Readings from Last 3 Encounters:  05/15/16 164 lb 1.6 oz (74.4 kg)  10/24/15 160 lb (72.6 kg)  04/25/15 159 lb 3.2 oz (72.2 kg)    Physical Exam  Constitutional: She is oriented to person, place, and time. She appears well-developed and well-nourished. No distress.  HENT:  Head: Normocephalic and atraumatic.  Eyes: Conjunctivae and lids are normal. Right eye exhibits no discharge. Left eye exhibits no discharge. No scleral icterus.  Neck: Normal range of motion. Neck supple. No JVD present. Carotid bruit is not present.  Cardiovascular: Normal rate, regular rhythm and normal heart sounds.   Pulmonary/Chest: Effort normal and breath sounds normal.  Abdominal: Normal appearance. There  is no splenomegaly or hepatomegaly.  Musculoskeletal: Normal range of motion.  Neurological: She is alert and oriented to person, place, and time.  Skin: Skin is warm, dry and intact. No rash noted. No pallor.  Psychiatric: She has a normal mood and affect. Her behavior is normal. Judgment and thought content normal.    Results for orders placed or performed in visit on 10/24/15  Comprehensive metabolic panel  Result Value Ref Range   Glucose 84 65 - 99 mg/dL   BUN 34 (H) 6 - 24 mg/dL   Creatinine, Ser 2.12 (H) 0.57 - 1.00 mg/dL   GFR calc non Af Amer 25 (L) >59 mL/min/1.73   GFR calc Af Amer 29 (L) >59 mL/min/1.73   BUN/Creatinine Ratio 16 9 - 23   Sodium 142 134 - 144 mmol/L    Potassium 4.6 3.5 - 5.2 mmol/L   Chloride 105 96 - 106 mmol/L   CO2 23 18 - 29 mmol/L   Calcium 9.5 8.7 - 10.2 mg/dL   Total Protein 6.8 6.0 - 8.5 g/dL   Albumin 4.2 3.5 - 5.5 g/dL   Globulin, Total 2.6 1.5 - 4.5 g/dL   Albumin/Globulin Ratio 1.6 1.2 - 2.2   Bilirubin Total 0.4 0.0 - 1.2 mg/dL   Alkaline Phosphatase 108 39 - 117 IU/L   AST 22 0 - 40 IU/L   ALT 13 0 - 32 IU/L  Lipid Panel w/o Chol/HDL Ratio  Result Value Ref Range   Cholesterol, Total 181 100 - 199 mg/dL   Triglycerides 113 0 - 149 mg/dL   HDL 50 >39 mg/dL   VLDL Cholesterol Cal 23 5 - 40 mg/dL   LDL Calculated 108 (H) 0 - 99 mg/dL      Assessment & Plan:   Problem List Items Addressed This Visit      Unprioritized   Chronic kidney disease, stage 4 (severe) ([place])    Per nephrology      Essential hypertension, benign    Stable, continue present medications.        Hyperlipemia    Stable, continue present medications.         Other Visit Diagnoses    Annual physical exam    -  Primary       Follow up plan: Return in about 6 months (around 11/12/2016).

## 2016-05-15 NOTE — Assessment & Plan Note (Signed)
Per nephrology 

## 2016-05-15 NOTE — Assessment & Plan Note (Signed)
Stable, continue present medications.   

## 2016-08-08 ENCOUNTER — Encounter: Payer: Self-pay | Admitting: Family Medicine

## 2016-08-08 ENCOUNTER — Ambulatory Visit (INDEPENDENT_AMBULATORY_CARE_PROVIDER_SITE_OTHER): Payer: BLUE CROSS/BLUE SHIELD | Admitting: Family Medicine

## 2016-08-08 VITALS — BP 117/61 | HR 62 | Temp 98.6°F | Wt 164.2 lb

## 2016-08-08 DIAGNOSIS — R21 Rash and other nonspecific skin eruption: Secondary | ICD-10-CM | POA: Diagnosis not present

## 2016-08-08 MED ORDER — PREDNISONE 10 MG PO TABS: ORAL_TABLET | ORAL | 0 refills | Status: DC

## 2016-08-08 MED ORDER — HYDROXYZINE HCL 50 MG PO TABS: 50.0000 mg | ORAL_TABLET | Freq: Three times a day (TID) | ORAL | 0 refills | Status: DC | PRN

## 2016-08-08 MED ORDER — CLOBETASOL PROPIONATE 0.05 % EX CREA: 1.0000 "application " | TOPICAL_CREAM | Freq: Two times a day (BID) | CUTANEOUS | 1 refills | Status: DC

## 2016-08-08 NOTE — Progress Notes (Signed)
   BP 117/61 (BP Location: Right Arm, Patient Position: Sitting, Cuff Size: Normal)   Pulse 62   Temp 98.6 F (37 C)   Wt 164 lb 3.2 oz (74.5 kg)   LMP  (LMP Unknown)   SpO2 98%   BMI 30.82 kg/m    Subjective:    Patient ID: Emma Sullivan, female    DOB: December 15, 1960, 56 y.o.   MRN: [id number]  HPI: Emma Sullivan is a 56 y.o. female  Chief Complaint  Patient presents with  . Rash    x's 5 days. Chest, neck, shoulder. Chest section is painful.   Patient presents with 5 day hx of itchy blistering rash that started as a patch in her mid-chest area and has spread intermittently to shoulders, lower abdomen, and this morning a few spots came up on back of her neck. States this happens almost every spring at some point, can't pinpoint any new soaps, lotions, detergents, etc that could be causing it. Has been using cortisone cream with no relief.   Relevant past medical, surgical, family and social history reviewed and updated as indicated. Interim medical history since our last visit reviewed. Allergies and medications reviewed and updated.  Review of Systems  Constitutional: Negative.   HENT: Negative.   Eyes: Negative.   Respiratory: Negative.   Cardiovascular: Negative.   Gastrointestinal: Negative.   Musculoskeletal: Negative.   Skin: Positive for rash.  Neurological: Negative.   Psychiatric/Behavioral: Negative.    Per HPI unless specifically indicated above     Objective:    BP 117/61 (BP Location: Right Arm, Patient Position: Sitting, Cuff Size: Normal)   Pulse 62   Temp 98.6 F (37 C)   Wt 164 lb 3.2 oz (74.5 kg)   LMP  (LMP Unknown)   SpO2 98%   BMI 30.82 kg/m   Wt Readings from Last 3 Encounters:  08/08/16 164 lb 3.2 oz (74.5 kg)  05/15/16 164 lb 1.6 oz (74.4 kg)  10/24/15 160 lb (72.6 kg)    Physical Exam  Constitutional: She is oriented to person, place, and time. She appears well-developed and well-nourished. No distress.  HENT:  Head: Atraumatic.    Eyes: Conjunctivae are normal. No scleral icterus.  Neck: Normal range of motion. Neck supple.  Cardiovascular: Normal rate and normal heart sounds.   Pulmonary/Chest: Effort normal and breath sounds normal. No respiratory distress.  Musculoskeletal: Normal range of motion.  Neurological: She is alert and oriented to person, place, and time.  Skin: Skin is warm and dry. Rash (Blistered erythematous midline rash about 2 inches in diameter on chest. Smaller erythematous papular rashes sporadically over other areas) noted.  Psychiatric: She has a normal mood and affect. Her behavior is normal.  Nursing note and vitals reviewed.     Assessment & Plan:   Problem List Items Addressed This Visit    None    Visit Diagnoses    Rash    -  Primary   Suspect allergic, but unclear trigger. Hydroxyzine, prednisone taper, and clobetasol sent. Discussed precautions with medications. F/u if no improvement       Follow up plan: Return if symptoms worsen or fail to improve.

## 2016-08-08 NOTE — Patient Instructions (Signed)
Follow up if no improvement 

## 2016-11-13 ENCOUNTER — Ambulatory Visit: Payer: BLUE CROSS/BLUE SHIELD | Admitting: Unknown Physician Specialty

## 2016-12-03 ENCOUNTER — Ambulatory Visit: Payer: BLUE CROSS/BLUE SHIELD | Admitting: Unknown Physician Specialty

## 2016-12-05 ENCOUNTER — Other Ambulatory Visit: Payer: Self-pay | Admitting: Unknown Physician Specialty

## 2016-12-11 ENCOUNTER — Ambulatory Visit (INDEPENDENT_AMBULATORY_CARE_PROVIDER_SITE_OTHER): Payer: BLUE CROSS/BLUE SHIELD | Admitting: Unknown Physician Specialty

## 2016-12-11 ENCOUNTER — Encounter: Payer: Self-pay | Admitting: Unknown Physician Specialty

## 2016-12-11 DIAGNOSIS — E7800 Pure hypercholesterolemia, unspecified: Secondary | ICD-10-CM

## 2016-12-11 DIAGNOSIS — I10 Essential (primary) hypertension: Secondary | ICD-10-CM | POA: Diagnosis not present

## 2016-12-11 DIAGNOSIS — N184 Chronic kidney disease, stage 4 (severe): Secondary | ICD-10-CM

## 2016-12-11 MED ORDER — METOPROLOL TARTRATE 100 MG PO TABS: 100.0000 mg | ORAL_TABLET | Freq: Two times a day (BID) | ORAL | 1 refills | Status: DC

## 2016-12-11 MED ORDER — PRAVASTATIN SODIUM 10 MG PO TABS: 10.0000 mg | ORAL_TABLET | Freq: Every day | ORAL | 1 refills | Status: DC

## 2016-12-11 MED ORDER — NIFEDIPINE ER OSMOTIC RELEASE 90 MG PO TB24: 90.0000 mg | ORAL_TABLET | Freq: Every day | ORAL | 0 refills | Status: DC

## 2016-12-11 NOTE — Assessment & Plan Note (Signed)
Pt taking a small dose of Pravastatin.  LDL typically below 100.  Checking labs today won't change treatment

## 2016-12-11 NOTE — Assessment & Plan Note (Signed)
Stable, continue present medications.   

## 2016-12-11 NOTE — Progress Notes (Signed)
BP 120/77   Pulse (!) 58   Temp 98.6 F (37 C)   Wt 165 lb (74.8 kg)   LMP  (LMP Unknown)   SpO2 98%   BMI 30.97 kg/m    Subjective:    Patient ID: Emma Sullivan, female    DOB: 29-Mar-1960, 56 y.o.   MRN: [id number]  HPI: Emma Sullivan is a 56 y.o. female  Chief Complaint  Patient presents with  . Hyperlipidemia  . Hypertension   Hypertension Using medications without difficulty Average home BPs Not checking   No problems or lightheadedness No chest pain with exertion or shortness of breath No Edema  Hyperlipidemia Using medications without problems: No Muscle aches  Diet compliance:Exercise: No changes  CKD Stage 4.  Pt has just seen Nephrology and seeing every 6 months.  I don/t have notes and don't know what her last GFR was   Relevant past medical, surgical, family and social history reviewed and updated as indicated. Interim medical history since our last visit reviewed. Allergies and medications reviewed and updated.  Review of Systems  Per HPI unless specifically indicated above     Objective:    BP 120/77   Pulse (!) 58   Temp 98.6 F (37 C)   Wt 165 lb (74.8 kg)   LMP  (LMP Unknown)   SpO2 98%   BMI 30.97 kg/m   Wt Readings from Last 3 Encounters:  12/11/16 165 lb (74.8 kg)  08/08/16 164 lb 3.2 oz (74.5 kg)  05/15/16 164 lb 1.6 oz (74.4 kg)    Physical Exam  Constitutional: She is oriented to person, place, and time. She appears well-developed and well-nourished. No distress.  HENT:  Head: Normocephalic and atraumatic.  Eyes: Conjunctivae and lids are normal. Right eye exhibits no discharge. Left eye exhibits no discharge. No scleral icterus.  Neck: Normal range of motion. Neck supple. No JVD present. Carotid bruit is not present.  Cardiovascular: Normal rate, regular rhythm and normal heart sounds.   Pulmonary/Chest: Effort normal and breath sounds normal.  Abdominal: Normal appearance. There is no splenomegaly or hepatomegaly.    Musculoskeletal: Normal range of motion.  Neurological: She is alert and oriented to person, place, and time.  Skin: Skin is warm, dry and intact. No rash noted. No pallor.  Psychiatric: She has a normal mood and affect. Her behavior is normal. Judgment and thought content normal.    Results for orders placed or performed in visit on 10/24/15  Comprehensive metabolic panel  Result Value Ref Range   Glucose 84 65 - 99 mg/dL   BUN 34 (H) 6 - 24 mg/dL   Creatinine, Ser 2.12 (H) 0.57 - 1.00 mg/dL   GFR calc non Af Amer 25 (L) >59 mL/min/1.73   GFR calc Af Amer 29 (L) >59 mL/min/1.73   BUN/Creatinine Ratio 16 9 - 23   Sodium 142 134 - 144 mmol/L   Potassium 4.6 3.5 - 5.2 mmol/L   Chloride 105 96 - 106 mmol/L   CO2 23 18 - 29 mmol/L   Calcium 9.5 8.7 - 10.2 mg/dL   Total Protein 6.8 6.0 - 8.5 g/dL   Albumin 4.2 3.5 - 5.5 g/dL   Globulin, Total 2.6 1.5 - 4.5 g/dL   Albumin/Globulin Ratio 1.6 1.2 - 2.2   Bilirubin Total 0.4 0.0 - 1.2 mg/dL   Alkaline Phosphatase 108 39 - 117 IU/L   AST 22 0 - 40 IU/L   ALT 13 0 - 32 IU/L  Lipid  Panel w/o Chol/HDL Ratio  Result Value Ref Range   Cholesterol, Total 181 100 - 199 mg/dL   Triglycerides 113 0 - 149 mg/dL   HDL 50 >39 mg/dL   VLDL Cholesterol Cal 23 5 - 40 mg/dL   LDL Calculated 108 (H) 0 - 99 mg/dL      Assessment & Plan:   Problem List Items Addressed This Visit      Unprioritized   Chronic kidney disease, stage 4 (severe) (H[facility])    Stable, continue present medications.        Essential hypertension, benign    Stable, continue present medications.        Relevant Medications   pravastatin (PRAVACHOL) 10 MG tablet   metoprolol tartrate (LOPRESSOR) 100 MG tablet   NIFEdipine (PROCARDIA XL/ADALAT-CC) 90 MG 24 hr tablet   Hyperlipemia    Pt taking a small dose of Pravastatin.  LDL typically below 100.  Checking labs today won't change treatment      Relevant Medications   pravastatin (PRAVACHOL) 10 MG tablet    metoprolol tartrate (LOPRESSOR) 100 MG tablet   NIFEdipine (PROCARDIA XL/ADALAT-CC) 90 MG 24 hr tablet       Follow up plan: Return in about 6 months (around 06/10/2017) for physical.

## 2017-06-10 ENCOUNTER — Encounter: Payer: Self-pay | Admitting: Unknown Physician Specialty

## 2017-06-10 ENCOUNTER — Ambulatory Visit: Payer: BLUE CROSS/BLUE SHIELD | Admitting: Unknown Physician Specialty

## 2017-06-10 VITALS — BP 121/80 | HR 66 | Temp 98.6°F | Ht 61.6 in | Wt 172.0 lb

## 2017-06-10 DIAGNOSIS — I10 Essential (primary) hypertension: Secondary | ICD-10-CM

## 2017-06-10 DIAGNOSIS — E7800 Pure hypercholesterolemia, unspecified: Secondary | ICD-10-CM

## 2017-06-10 DIAGNOSIS — Z0000 Encounter for general adult medical examination without abnormal findings: Secondary | ICD-10-CM

## 2017-06-10 NOTE — Assessment & Plan Note (Signed)
Stable, continue present medications.   

## 2017-06-10 NOTE — Assessment & Plan Note (Signed)
Check Lipid panel 

## 2017-06-10 NOTE — Progress Notes (Signed)
BP 121/80   Pulse 66   Temp 98.6 F (37 C) (Oral)   Ht 5' 1.6" (1.565 m)   Wt 172 lb (78 kg)   LMP  (LMP Unknown)   SpO2 97%   BMI 31.87 kg/m    Subjective:    Patient ID: Emma Sullivan, female    DOB: 1960-07-25, 57 y.o.   MRN: [id number]  HPI: Emma Sullivan is a 57 y.o. female  Pt is here for her general history and physical.  She has no complaints.  History significant for stage 4 CKD.  She is seeing Nephrology.  Refusing health maintenance.  Labs done my Nephrology showing GFR of 27 with stable electrolytes.  CBC is normal.  She does need her cholsesterol checked.    Hypertension Using medications without difficulty Average home BPs: not checking   No problems or lightheadedness No chest pain with exertion or shortness of breath No Edema   Hyperlipidemia Using medications without problems: No Muscle aches  Diet compliance:Exercise:"fine"   Chief Complaint  Patient presents with  . Annual Exam   Social History   Socioeconomic History  . Marital status: Married    Spouse name: Not on file  . Number of children: Not on file  . Years of education: Not on file  . Highest education level: Not on file  Social Needs  . Financial resource strain: Not on file  . Food insecurity - worry: Not on file  . Food insecurity - inability: Not on file  . Transportation needs - medical: Not on file  . Transportation needs - non-medical: Not on file  Occupational History  . Not on file  Tobacco Use  . Smoking status: Never Smoker  . Smokeless tobacco: Never Used  Substance and Sexual Activity  . Alcohol use: No  . Drug use: No  . Sexual activity: Yes  Other Topics Concern  . Not on file  Social History Narrative  . Not on file   Family History  Problem Relation Age of Onset  . Diabetes Mother   . Hypertension Mother   . Heart disease Father   . Hypertension Daughter    Past Medical History:  Diagnosis Date  . Anxiety   . Depression   . Hyperlipidemia     . Hypertension    Social History   Socioeconomic History  . Marital status: Married    Spouse name: None  . Number of children: None  . Years of education: None  . Highest education level: None  Social Needs  . Financial resource strain: None  . Food insecurity - worry: None  . Food insecurity - inability: None  . Transportation needs - medical: None  . Transportation needs - non-medical: None  Occupational History  . None  Tobacco Use  . Smoking status: Never Smoker  . Smokeless tobacco: Never Used  Substance and Sexual Activity  . Alcohol use: No  . Drug use: No  . Sexual activity: Yes  Other Topics Concern  . None  Social History Narrative  . None     Relevant past medical, surgical, family and social history reviewed and updated as indicated. Interim medical history since our last visit reviewed. Allergies and medications reviewed and updated.  Review of Systems  Per HPI unless specifically indicated above     Objective:    BP 121/80   Pulse 66   Temp 98.6 F (37 C) (Oral)   Ht 5' 1.6" (1.565 m)   Wt 172 lb (  78 kg)   LMP  (LMP Unknown)   SpO2 97%   BMI 31.87 kg/m   Wt Readings from Last 3 Encounters:  06/10/17 172 lb (78 kg)  12/11/16 165 lb (74.8 kg)  08/08/16 164 lb 3.2 oz (74.5 kg)    Physical Exam  Constitutional: She is oriented to person, place, and time. She appears well-developed and well-nourished. No distress.  HENT:  Head: Normocephalic and atraumatic.  Eyes: Conjunctivae and lids are normal. Right eye exhibits no discharge. Left eye exhibits no discharge. No scleral icterus.  Neck: Normal range of motion. Neck supple. No JVD present. Carotid bruit is not present.  Cardiovascular: Normal rate, regular rhythm and normal heart sounds.  Pulmonary/Chest: Effort normal and breath sounds normal.  Abdominal: Normal appearance. There is no splenomegaly or hepatomegaly.  Musculoskeletal: Normal range of motion.  Neurological: She is alert and  oriented to person, place, and time.  Skin: Skin is warm, dry and intact. No rash noted. No pallor.  Psychiatric: She has a normal mood and affect. Her behavior is normal. Judgment and thought content normal.    Results for orders placed or performed in visit on 10/24/15  Comprehensive metabolic panel  Result Value Ref Range   Glucose 84 65 - 99 mg/dL   BUN 34 (H) 6 - 24 mg/dL   Creatinine, Ser 2.12 (H) 0.57 - 1.00 mg/dL   GFR calc non Af Amer 25 (L) >59 mL/min/1.73   GFR calc Af Amer 29 (L) >59 mL/min/1.73   BUN/Creatinine Ratio 16 9 - 23   Sodium 142 134 - 144 mmol/L   Potassium 4.6 3.5 - 5.2 mmol/L   Chloride 105 96 - 106 mmol/L   CO2 23 18 - 29 mmol/L   Calcium 9.5 8.7 - 10.2 mg/dL   Total Protein 6.8 6.0 - 8.5 g/dL   Albumin 4.2 3.5 - 5.5 g/dL   Globulin, Total 2.6 1.5 - 4.5 g/dL   Albumin/Globulin Ratio 1.6 1.2 - 2.2   Bilirubin Total 0.4 0.0 - 1.2 mg/dL   Alkaline Phosphatase 108 39 - 117 IU/L   AST 22 0 - 40 IU/L   ALT 13 0 - 32 IU/L  Lipid Panel w/o Chol/HDL Ratio  Result Value Ref Range   Cholesterol, Total 181 100 - 199 mg/dL   Triglycerides 113 0 - 149 mg/dL   HDL 50 >39 mg/dL   VLDL Cholesterol Cal 23 5 - 40 mg/dL   LDL Calculated 108 (H) 0 - 99 mg/dL      Assessment & Plan:   Problem List Items Addressed This Visit      Unprioritized   Essential hypertension, benign    Stable, continue present medications.        Hyperlipemia    Check Lipid panel      Relevant Orders   Lipid Panel w/o Chol/HDL Ratio   TSH    Other Visit Diagnoses    Annual physical exam    -  Primary   Relevant Orders   Lipid Panel w/o Chol/HDL Ratio   TSH      Due for but refusing: Pap smear Pelvic exam mammogram  Follow up plan: Return in about 6 months (around 12/11/2017).

## 2017-06-11 ENCOUNTER — Encounter: Payer: Self-pay | Admitting: Unknown Physician Specialty

## 2017-06-11 LAB — TSH: TSH: 1.47 u[IU]/mL (ref 0.450–4.500)

## 2017-06-11 LAB — LIPID PANEL W/O CHOL/HDL RATIO
Cholesterol, Total: 176 mg/dL (ref 100–199)
HDL: 47 mg/dL (ref >39)
LDL CALC: 107 mg/dL — AB (ref 0–99)
Triglycerides: 112 mg/dL (ref 0–149)
VLDL CHOLESTEROL CAL: 22 mg/dL (ref 5–40)

## 2017-06-11 NOTE — Progress Notes (Signed)
   LMP  (LMP Unknown)    Subjective:    Patient ID: Emma Sullivan, female    DOB: 1960/06/19, 57 y.o.   MRN: [id number]  HPI: Emma Sullivan is a 57 y.o. female  No chief complaint on file.   Relevant past medical, surgical, family and social history reviewed and updated as indicated. Interim medical history since our last visit reviewed. Allergies and medications reviewed and updated.  Review of Systems  Per HPI unless specifically indicated above     Objective:    LMP  (LMP Unknown)   Wt Readings from Last 3 Encounters:  06/10/17 172 lb (78 kg)  12/11/16 165 lb (74.8 kg)  08/08/16 164 lb 3.2 oz (74.5 kg)    Physical Exam  Results for orders placed or performed in visit on 06/10/17  Lipid Panel w/o Chol/HDL Ratio  Result Value Ref Range   Cholesterol, Total 176 100 - 199 mg/dL   Triglycerides 112 0 - 149 mg/dL   HDL 47 >39 mg/dL   VLDL Cholesterol Cal 22 5 - 40 mg/dL   LDL Calculated 107 (H) 0 - 99 mg/dL  TSH  Result Value Ref Range   TSH 1.470 0.450 - 4.500 uIU/mL      Assessment & Plan:   Problem List Items Addressed This Visit    None       Follow up plan: No Follow-up on file.

## 2017-07-26 ENCOUNTER — Other Ambulatory Visit: Payer: Self-pay | Admitting: Unknown Physician Specialty

## 2017-09-03 ENCOUNTER — Ambulatory Visit (INDEPENDENT_AMBULATORY_CARE_PROVIDER_SITE_OTHER): Payer: BLUE CROSS/BLUE SHIELD | Admitting: Family Medicine

## 2017-09-03 ENCOUNTER — Encounter: Payer: Self-pay | Admitting: Family Medicine

## 2017-09-03 VITALS — BP 138/84 | HR 52 | Temp 98.0°F | Wt 172.2 lb

## 2017-09-03 DIAGNOSIS — L509 Urticaria, unspecified: Secondary | ICD-10-CM

## 2017-09-03 MED ORDER — TRIAMCINOLONE ACETONIDE 40 MG/ML IJ SUSP
40.0000 mg | Freq: Once | INTRAMUSCULAR | Status: AC
  Administered 2017-09-03: 40 mg via INTRAMUSCULAR

## 2017-09-03 MED ORDER — CLOBETASOL PROPIONATE 0.05 % EX CREA: 1.0000 "application " | TOPICAL_CREAM | Freq: Two times a day (BID) | CUTANEOUS | 1 refills | Status: DC

## 2017-09-03 MED ORDER — PREDNISONE 10 MG PO TABS: ORAL_TABLET | ORAL | 0 refills | Status: DC

## 2017-09-03 NOTE — Patient Instructions (Addendum)
Benadryl at bedtime Zyrtec/Allegra/Claritin during the day Zantac (150mg ) 2x a day   Hives Hives (urticaria) are itchy, red, swollen areas on your skin. Hives can appear on any part of your body and can vary in size. They can be as small as the tip of a pen or much larger. Hives often fade within 24 hours (acute hives). In other cases, new hives appear after old ones fade. This cycle can continue for several days or weeks (chronic hives). Hives result from your body's reaction to an irritant or to something that you are allergic to (trigger). When you are exposed to a trigger, your body releases a chemical (histamine) that causes redness, itching, and swelling. You can get hives immediately after being exposed to a trigger or hours later. Hives do not spread from person to person (are not contagious). Your hives may get worse with scratching, exercise, and emotional stress. What are the causes? Causes of this condition include:  Allergies to certain foods or ingredients.  Insect bites or stings.  Exposure to pollen or pet dander.  Contact with latex or chemicals.  Spending time in sunlight, heat, or cold (exposure).  Exercise.  Stress.  You can also get hives from some medical conditions and treatments. These include:  Viruses, including the common cold.  Bacterial infections, such as urinary tract infections and strep throat.  Disorders such as vasculitis, lupus, or thyroid disease.  Certain medications.  Allergy shots.  Blood transfusions.  Sometimes, the cause of hives is not known (idiopathic hives). What increases the risk? This condition is more likely to develop in:  Women.  People who have food allergies, especially to citrus fruits, milk, eggs, peanuts, tree nuts, or shellfish.  People who are allergic to: ? Medicines. ? Latex. ? Insects. ? Animals. ? Pollen.  People who have certain medical conditions, includinglupus or thyroid disease.  What are  the signs or symptoms? The main symptom of this condition is raised, itchyred or white bumps or patches on your skin. These areas may:  Become large and swollen (welts).  Change in shape and location, quickly and repeatedly.  Be separate hives or connect over a large area of skin.  Sting or become painful.  Turn white when pressed in the center (blanch).  In severe cases, yourhands, feet, and face may also become swollen. This may occur if hives develop deeper in your skin. How is this diagnosed? This condition is diagnosed based on your symptoms, medical history, and physical exam. Your skin, urine, or blood may be tested to find out what is causing your hives and to rule out other health issues. Your health care provider may also remove a small sample of skin from the affected area and examine it under a microscope (biopsy). How is this treated? Treatment depends on the severity of your condition. Your health care provider may recommend using cool, wet cloths (cool compresses) or taking cool showers to relieve itching. Hives are sometimes treated with medicines, including:  Antihistamines.  Corticosteroids.  Antibiotics.  An injectable medicine (omalizumab). Your health care provider may prescribe this if you have chronic idiopathic hives and you continue to have symptoms even after treatment with antihistamines.  Severe cases may require an emergency injection of adrenaline (epinephrine) to prevent a life-threatening allergic reaction (anaphylaxis). Follow these instructions at home: Medicines  Take or apply over-the-counter and prescription medicines only as told by your health care provider.  If you were prescribed an antibiotic medicine, use it as told by  your health care provider. Do not stop taking the antibiotic even if you start to feel better. Skin Care  Apply cool compresses to the affected areas.  Do not scratch or rub your skin. General instructions  Do not  take hot showers or baths. This can make itching worse.  Do not wear tight-fitting clothing.  Use sunscreen and wear protective clothing when you are outside.  Avoid any substances that cause your hives. Keep a journal to help you track what causes your hives. Write down: ? What medicines you take. ? What you eat and drink. ? What products you use on your skin.  Keep all follow-up visits as told by your health care provider. This is important. Contact a health care provider if:  Your symptoms are not controlled with medicine.  Your joints are painful or swollen. Get help right away if:  You have a fever.  You have pain in your abdomen.  Your tongue or lips are swollen.  Your eyelids are swollen.  Your chest or throat feels tight.  You have trouble breathing or swallowing. These symptoms may represent a serious problem that is an emergency. Do not wait to see if the symptoms will go away. Get medical help right away. Call your local emergency services (911 in the [place] Do not drive yourself to the hospital. This information is not intended to replace advice given to you by your health care provider. Make sure you discuss any questions you have with your health care provider. Document Released: 03/11/2005 Document Revised: 08/09/2015 Document Reviewed: 12/28/2014 Elsevier Interactive Patient Education  2018 [employer].

## 2017-09-03 NOTE — Progress Notes (Signed)
BP 138/84 (BP Location: Left Arm, Patient Position: Sitting, Cuff Size: Normal)   Pulse (!) 52   Temp 98 F (36.7 C)   Wt 172 lb 4 oz (78.1 kg)   LMP  (LMP Unknown)   SpO2 98%   BMI 31.92 kg/m    Subjective:    Patient ID: Casey Burkitt, female    DOB: May 30, 1960, 57 y.o.   MRN: [id number]  HPI: Jessice Madill is a 57 y.o. female  Chief Complaint  Patient presents with  . Rash    X 1 week, arms, head, ears, neck and hand   RASH Duration:  1 week  Location: generalized  Itching: yes Burning: no Redness: yes Oozing: no Scaling: no Blisters: no Painful: no Fevers: no Change in detergents/soaps/personal care products: no Recent illness: no Recent travel:no History of same: yes Context: worse Alleviating factors: hydrocortisone cream Treatments attempted:hydrocortisone cream Shortness of breath: no  Throat/tongue swelling: no Myalgias/arthralgias: no  Relevant past medical, surgical, family and social history reviewed and updated as indicated. Interim medical history since our last visit reviewed. Allergies and medications reviewed and updated.  Review of Systems  Constitutional: Negative.   Respiratory: Negative.   Cardiovascular: Negative.   Skin: Positive for rash. Negative for color change, pallor and wound.  Psychiatric/Behavioral: Negative.     Per HPI unless specifically indicated above     Objective:    BP 138/84 (BP Location: Left Arm, Patient Position: Sitting, Cuff Size: Normal)   Pulse (!) 52   Temp 98 F (36.7 C)   Wt 172 lb 4 oz (78.1 kg)   LMP  (LMP Unknown)   SpO2 98%   BMI 31.92 kg/m   Wt Readings from Last 3 Encounters:  09/03/17 172 lb 4 oz (78.1 kg)  06/10/17 172 lb (78 kg)  12/11/16 165 lb (74.8 kg)    Physical Exam  Constitutional: She is oriented to person, place, and time. She appears well-developed and well-nourished. No distress.  HENT:  Head: Normocephalic and atraumatic.  Right Ear: Hearing normal.  Left Ear:  Hearing normal.  Nose: Nose normal.  Eyes: Conjunctivae and lids are normal. Right eye exhibits no discharge. Left eye exhibits no discharge. No scleral icterus.  Cardiovascular: Normal rate, regular rhythm, normal heart sounds and intact distal pulses. Exam reveals no gallop and no friction rub.  No murmur heard. Pulmonary/Chest: Effort normal and breath sounds normal. No stridor. No respiratory distress. She has no wheezes. She has no rales. She exhibits no tenderness.  Musculoskeletal: Normal range of motion.  Neurological: She is alert and oriented to person, place, and time.  Skin: Skin is warm, dry and intact. Capillary refill takes less than 2 seconds. Rash (urticaria all over ) noted. She is not diaphoretic. No erythema. No pallor.  Psychiatric: She has a normal mood and affect. Her speech is normal and behavior is normal. Judgment and thought content normal. Cognition and memory are normal.  Nursing note and vitals reviewed.   Results for orders placed or performed in visit on 06/10/17  Lipid Panel w/o Chol/HDL Ratio  Result Value Ref Range   Cholesterol, Total 176 100 - 199 mg/dL   Triglycerides 112 0 - 149 mg/dL   HDL 47 >39 mg/dL   VLDL Cholesterol Cal 22 5 - 40 mg/dL   LDL Calculated 107 (H) 0 - 99 mg/dL  TSH  Result Value Ref Range   TSH 1.470 0.450 - 4.500 uIU/mL      Assessment & Plan:  Problem List Items Addressed This Visit    None    Visit Diagnoses    Hives    -  Primary   Can't see allergy now. Will treat with steroid injection and prednisone taper. Benadryl, zyrtec and zantac recommended. Call if not getting better.    Relevant Medications   triamcinolone acetonide (KENALOG-40) injection 40 mg (Start on 09/03/2017  8:45 AM)       Follow up plan: Return if symptoms worsen or fail to improve.

## 2017-09-10 ENCOUNTER — Telehealth: Payer: Self-pay

## 2017-09-10 NOTE — Telephone Encounter (Signed)
Yes please, Call in. Let me know if you need new Rx

## 2017-09-10 NOTE — Telephone Encounter (Signed)
Medication called in 

## 2017-09-10 NOTE — Telephone Encounter (Signed)
Could Clobetasol ointment be called in instead of cream, I think it may be covered.

## 2017-09-16 ENCOUNTER — Other Ambulatory Visit: Payer: Self-pay | Admitting: Family Medicine

## 2017-09-18 ENCOUNTER — Other Ambulatory Visit: Payer: Self-pay | Admitting: Unknown Physician Specialty

## 2017-09-18 MED ORDER — NIFEDIPINE ER OSMOTIC RELEASE 90 MG PO TB24: 90.0000 mg | ORAL_TABLET | Freq: Every day | ORAL | 0 refills | Status: DC

## 2017-09-18 NOTE — Telephone Encounter (Signed)
Received a call from [place] at [facility] stating patient is out of her nifedipine. She states they have tried several times to get this medication refilled.  Thank you

## 2017-10-25 ENCOUNTER — Other Ambulatory Visit: Payer: Self-pay | Admitting: Unknown Physician Specialty

## 2017-12-12 ENCOUNTER — Encounter: Payer: Self-pay | Admitting: Unknown Physician Specialty

## 2017-12-12 ENCOUNTER — Ambulatory Visit: Payer: BLUE CROSS/BLUE SHIELD | Admitting: Unknown Physician Specialty

## 2017-12-12 DIAGNOSIS — E7800 Pure hypercholesterolemia, unspecified: Secondary | ICD-10-CM | POA: Diagnosis not present

## 2017-12-12 DIAGNOSIS — N184 Chronic kidney disease, stage 4 (severe): Secondary | ICD-10-CM

## 2017-12-12 DIAGNOSIS — I10 Essential (primary) hypertension: Secondary | ICD-10-CM | POA: Diagnosis not present

## 2017-12-12 MED ORDER — METOPROLOL TARTRATE 100 MG PO TABS: 100.0000 mg | ORAL_TABLET | Freq: Two times a day (BID) | ORAL | 0 refills | Status: DC

## 2017-12-12 MED ORDER — NIFEDIPINE ER OSMOTIC RELEASE 90 MG PO TB24: 90.0000 mg | ORAL_TABLET | Freq: Every day | ORAL | 0 refills | Status: DC

## 2017-12-12 MED ORDER — PRAVASTATIN SODIUM 10 MG PO TABS: 10.0000 mg | ORAL_TABLET | Freq: Every day | ORAL | 1 refills | Status: DC

## 2017-12-12 NOTE — Assessment & Plan Note (Signed)
Stable, continue present medications.   

## 2017-12-12 NOTE — Progress Notes (Signed)
BP 134/83   Pulse (!) 54   Temp 98.4 F (36.9 C) (Oral)   Ht 5' 1.5" (1.562 m)   Wt 176 lb 4.8 oz (80 kg)   LMP  (LMP Unknown)   SpO2 98%   BMI 32.77 kg/m    Subjective:    Patient ID: Emma Sullivan, female    DOB: September 22, 1960, 57 y.o.   MRN: [id number]  HPI: Emma Sullivan is a 57 y.o. female  Chief Complaint  Patient presents with  . Hyperlipidemia  . Hypertension   Hypertension Using medications without difficulty Average home BPs Not checking   No problems or lightheadedness No chest pain with exertion or shortness of breath No Edema  Hyperlipidemia Using medications without problems: No Muscle aches  Diet compliance:Exercise: Sedentary  Relevant past medical, surgical, family and social history reviewed and updated as indicated. Interim medical history since our last visit reviewed. Allergies and medications reviewed and updated.  Review of Systems  Per HPI unless specifically indicated above     Objective:    BP 134/83   Pulse (!) 54   Temp 98.4 F (36.9 C) (Oral)   Ht 5' 1.5" (1.562 m)   Wt 176 lb 4.8 oz (80 kg)   LMP  (LMP Unknown)   SpO2 98%   BMI 32.77 kg/m   Wt Readings from Last 3 Encounters:  12/12/17 176 lb 4.8 oz (80 kg)  09/03/17 172 lb 4 oz (78.1 kg)  06/10/17 172 lb (78 kg)    Physical Exam  Constitutional: She is oriented to person, place, and time. She appears well-developed and well-nourished. No distress.  HENT:  Head: Normocephalic and atraumatic.  Eyes: Conjunctivae and lids are normal. Right eye exhibits no discharge. Left eye exhibits no discharge. No scleral icterus.  Neck: Normal range of motion. Neck supple. No JVD present. Carotid bruit is not present.  Cardiovascular: Normal rate, regular rhythm and normal heart sounds.  Pulmonary/Chest: Effort normal and breath sounds normal.  Abdominal: Normal appearance. There is no splenomegaly or hepatomegaly.  Musculoskeletal: Normal range of motion.  Neurological: She is  alert and oriented to person, place, and time.  Skin: Skin is warm, dry and intact. No rash noted. No pallor.  Psychiatric: She has a normal mood and affect. Her behavior is normal. Judgment and thought content normal.    Results for orders placed or performed in visit on 06/10/17  Lipid Panel w/o Chol/HDL Ratio  Result Value Ref Range   Cholesterol, Total 176 100 - 199 mg/dL   Triglycerides 112 0 - 149 mg/dL   HDL 47 >39 mg/dL   VLDL Cholesterol Cal 22 5 - 40 mg/dL   LDL Calculated 107 (H) 0 - 99 mg/dL  TSH  Result Value Ref Range   TSH 1.470 0.450 - 4.500 uIU/mL      Assessment & Plan:   Problem List Items Addressed This Visit      Unprioritized   Chronic kidney disease, stage 4 (severe) ([place])    Per [facility]      Essential hypertension, benign    Stable, continue present medications.        Relevant Medications   NIFEdipine (PROCARDIA XL/ADALAT-CC) 90 MG 24 hr tablet   metoprolol tartrate (LOPRESSOR) 100 MG tablet   pravastatin (PRAVACHOL) 10 MG tablet   Hyperlipemia    On statins without myalgias      Relevant Medications   NIFEdipine (PROCARDIA XL/ADALAT-CC) 90 MG 24 hr tablet   metoprolol tartrate (LOPRESSOR)  100 MG tablet   pravastatin (PRAVACHOL) 10 MG tablet      Labs done recently through [facility]  Follow up plan: Return in about 6 months (around 06/12/2018).  Refusing to be scheduled for pap smear

## 2017-12-12 NOTE — Assessment & Plan Note (Signed)
Per [facility] Nephrology

## 2017-12-12 NOTE — Assessment & Plan Note (Signed)
On statins without myalgias

## 2018-03-12 ENCOUNTER — Encounter: Payer: Self-pay | Admitting: Nurse Practitioner

## 2018-03-13 ENCOUNTER — Ambulatory Visit: Payer: BLUE CROSS/BLUE SHIELD | Admitting: Nurse Practitioner

## 2018-03-13 ENCOUNTER — Encounter: Payer: Self-pay | Admitting: Nurse Practitioner

## 2018-03-13 VITALS — BP 129/77 | HR 64 | Temp 98.1°F | Ht 62.0 in | Wt 179.2 lb

## 2018-03-13 DIAGNOSIS — I1310 Hypertensive heart and chronic kidney disease without heart failure, with stage 1 through stage 4 chronic kidney disease, or unspecified chronic kidney disease: Secondary | ICD-10-CM | POA: Diagnosis not present

## 2018-03-13 DIAGNOSIS — N184 Chronic kidney disease, stage 4 (severe): Secondary | ICD-10-CM | POA: Diagnosis not present

## 2018-03-13 DIAGNOSIS — E7800 Pure hypercholesterolemia, unspecified: Secondary | ICD-10-CM

## 2018-03-13 MED ORDER — METOPROLOL TARTRATE 100 MG PO TABS: 100.0000 mg | ORAL_TABLET | Freq: Two times a day (BID) | ORAL | 2 refills | Status: DC

## 2018-03-13 MED ORDER — NIFEDIPINE ER OSMOTIC RELEASE 90 MG PO TB24: 90.0000 mg | ORAL_TABLET | Freq: Every day | ORAL | 3 refills | Status: DC

## 2018-03-13 NOTE — Assessment & Plan Note (Signed)
Continue statin.  Lipid panel today.

## 2018-03-13 NOTE — Patient Instructions (Signed)
Healthy Eating Following a healthy eating pattern may help you to achieve and maintain a healthy body weight, reduce the risk of chronic disease, and live a long and productive life. It is important to follow a healthy eating pattern at an appropriate calorie level for your body. Your nutritional needs should be met primarily through food by choosing a variety of nutrient-rich foods. What are tips for following this plan? Reading food labels  Read labels and choose the following: ? Reduced or low sodium. ? Juices with 100% fruit juice. ? Foods with low saturated fats and high polyunsaturated and monounsaturated fats. ? Foods with whole grains, such as whole wheat, cracked wheat, brown rice, and wild rice. ? Whole grains that are fortified with folic acid. This is recommended for women who are pregnant or who want to become pregnant.  Read labels and avoid the following: ? Foods with a lot of added sugars. These include foods that contain brown sugar, corn sweetener, corn syrup, dextrose, fructose, glucose, high-fructose corn syrup, honey, invert sugar, lactose, malt syrup, maltose, molasses, raw sugar, sucrose, trehalose, or turbinado sugar.  Do not eat more than the following amounts of added sugar per day:  6 teaspoons (25 g) for women.  9 teaspoons (38 g) for men. ? Foods that contain processed or refined starches and grains. ? Refined grain products, such as white flour, degermed cornmeal, white bread, and white rice. Shopping  Choose nutrient-rich snacks, such as vegetables, whole fruits, and nuts. Avoid high-calorie and high-sugar snacks, such as potato chips, fruit snacks, and candy.  Use oil-based dressings and spreads on foods instead of solid fats such as butter, stick margarine, or cream cheese.  Limit pre-made sauces, mixes, and "instant" products such as flavored rice, instant noodles, and ready-made pasta.  Try more plant-protein sources, such as tofu, tempeh, black beans,  edamame, lentils, nuts, and seeds.  Explore eating plans such as the Mediterranean diet or vegetarian diet. Cooking  Use oil to saut or stir-fry foods instead of solid fats such as butter, stick margarine, or lard.  Try baking, boiling, grilling, or broiling instead of frying.  Remove the fatty part of meats before cooking.  Steam vegetables in water or broth. Meal planning   At meals, imagine dividing your plate into fourths: ? One-half of your plate is fruits and vegetables. ? One-fourth of your plate is whole grains. ? One-fourth of your plate is protein, especially lean meats, poultry, eggs, tofu, beans, or nuts.  Include low-fat dairy as part of your daily diet. Lifestyle  Choose healthy options in all settings, including home, work, school, restaurants, or stores.  Prepare your food safely: ? Wash your hands after handling raw meats. ? Keep food preparation surfaces clean by regularly washing with hot, soapy water. ? Keep raw meats separate from ready-to-eat foods, such as fruits and vegetables. ? Cook seafood, meat, poultry, and eggs to the recommended internal temperature. ? Store foods at safe temperatures. In general:  Keep cold foods at 59F (4.4C) or below.  Keep hot foods at 159F (60C) or above.  Keep your freezer at [facility] (-17.8C) or below.  Foods are no longer safe to eat when they have been between the temperatures of 40-159F (4.4-60C) for more than 2 hours. What foods should I eat? Fruits Aim to eat 2 cup-equivalents of fresh, canned (in natural juice), or frozen fruits each day. Examples of 1 cup-equivalent of fruit include 1 small apple, 8 large strawberries, 1 cup canned fruit,  cup  dried fruit, or 1 cup 100% juice. Vegetables Aim to eat 2-3 cup-equivalents of fresh and frozen vegetables each day, including different varieties and colors. Examples of 1 cup-equivalent of vegetables include 2 medium carrots, 2 cups raw, leafy greens, 1 cup chopped  vegetable (raw or cooked), or 1 medium baked potato. Grains Aim to eat 6 ounce-equivalents of whole grains each day. Examples of 1 ounce-equivalent of grains include 1 slice of bread, 1 cup ready-to-eat cereal, 3 cups popcorn, or  cup cooked rice, pasta, or cereal. Meats and other proteins Aim to eat 5-6 ounce-equivalents of protein each day. Examples of 1 ounce-equivalent of protein include 1 egg, 1/2 cup nuts or seeds, or 1 tablespoon (16 g) peanut butter. A cut of meat or fish that is the size of a deck of cards is about 3-4 ounce-equivalents.  Of the protein you eat each week, try to have at least 8 ounces come from seafood. This includes salmon, trout, herring, and anchovies. Dairy Aim to eat 3 cup-equivalents of fat-free or low-fat dairy each day. Examples of 1 cup-equivalent of dairy include 1 cup (240 mL) milk, 8 ounces (250 g) yogurt, 1 ounces (44 g) natural cheese, or 1 cup (240 mL) fortified soy milk. Fats and oils  Aim for about 5 teaspoons (21 g) per day. Choose monounsaturated fats, such as canola and olive oils, avocados, peanut butter, and most nuts, or polyunsaturated fats, such as sunflower, corn, and soybean oils, walnuts, pine nuts, sesame seeds, sunflower seeds, and flaxseed. Beverages  Aim for six 8-oz glasses of water per day. Limit coffee to three to five 8-oz cups per day.  Limit caffeinated beverages that have added calories, such as soda and energy drinks.  Limit alcohol intake to no more than 1 drink a day for nonpregnant women and 2 drinks a day for men. One drink equals 12 oz of beer (355 mL), 5 oz of wine (148 mL), or 1 oz of hard liquor (44 mL). Seasoning and other foods  Avoid adding excess amounts of salt to your foods. Try flavoring foods with herbs and spices instead of salt.  Avoid adding sugar to foods.  Try using oil-based dressings, sauces, and spreads instead of solid fats. This information is based on general [place] nutrition guidelines. For more  information, visit [employer]. Exact amounts may vary based on your nutrition needs. Summary  A healthy eating plan may help you to maintain a healthy weight, reduce the risk of chronic diseases, and stay active throughout your life.  Plan your meals. Make sure you eat the right portions of a variety of nutrient-rich foods.  Try baking, boiling, grilling, or broiling instead of frying.  Choose healthy options in all settings, including home, work, school, restaurants, or stores. This information is not intended to replace advice given to you by your health care provider. Make sure you discuss any questions you have with your health care provider. Document Released: 06/23/2017 Document Revised: 06/23/2017 Document Reviewed: 06/23/2017 Elsevier Interactive Patient Education  2019 [employer].

## 2018-03-13 NOTE — Assessment & Plan Note (Signed)
Chronic, stable.  BP below goal.  Continue current regimen and collaboration with [facility] nephrology.

## 2018-03-13 NOTE — Progress Notes (Signed)
BP 129/77 (BP Location: Left Arm, Patient Position: Sitting, Cuff Size: Normal)   Pulse 64   Temp 98.1 F (36.7 C) (Oral)   Ht 5\' 2"  (1.575 m)   Wt 179 lb 3.2 oz (81.3 kg)   LMP  (LMP Unknown)   SpO2 97%   BMI 32.78 kg/m    Subjective:    Patient ID: Emma Sullivan, female    DOB: April 09, 1960, 57 y.o.   MRN: [id number]  HPI: Emma Sullivan is a 57 y.o. female presents for 6 month f/u  Chief Complaint  Patient presents with  . Hypertension    6 month F/U. Patient states no complaints.  . Hyperlipidemia  . Chronic Kidney Disease    Managed by Nephrology   HYPERTENSION / HYPERLIPIDEMIA Reports no issues with current treatment regimen.  Last lipid panel in March, will repeat today. Satisfied with current treatment? yes Duration of hypertension: chronic BP monitoring frequency: not checking BP range:  BP medication side effects: no Duration of hyperlipidemia: chronic Cholesterol medication side effects: no Cholesterol supplements: none Medication compliance: excellent compliance Aspirin: no Recent stressors: no Recurrent headaches: no Visual changes: no Palpitations: no Dyspnea: no Chest pain: no Lower extremity edema: no Dizzy/lightheaded: no   CHRONIC KIDNEY DISEASE Followed by [facility] nephrology and last seen in August with labs done. CKD status: stable Medications renally dose: yes Previous renal evaluation: yes Pneumovax:  Up to Date Influenza Vaccine:  Not up to Date, refuses flu vaccine  Relevant past medical, surgical, family and social history reviewed and updated as indicated. Interim medical history since our last visit reviewed. Allergies and medications reviewed and updated.  Review of Systems  Constitutional: Negative for activity change, appetite change, fatigue and fever.  Respiratory: Negative for cough, chest tightness, shortness of breath and wheezing.   Cardiovascular: Negative for chest pain, palpitations and leg swelling.    Gastrointestinal: Negative for abdominal distention, abdominal pain, constipation, diarrhea, nausea and vomiting.  Endocrine: Negative.   Genitourinary: Negative for decreased urine volume.  Neurological: Negative for dizziness, numbness and headaches.  Psychiatric/Behavioral: Negative.     Per HPI unless specifically indicated above     Objective:    BP 129/77 (BP Location: Left Arm, Patient Position: Sitting, Cuff Size: Normal)   Pulse 64   Temp 98.1 F (36.7 C) (Oral)   Ht 5\' 2"  (1.575 m)   Wt 179 lb 3.2 oz (81.3 kg)   LMP  (LMP Unknown)   SpO2 97%   BMI 32.78 kg/m   Wt Readings from Last 3 Encounters:  03/13/18 179 lb 3.2 oz (81.3 kg)  12/12/17 176 lb 4.8 oz (80 kg)  09/03/17 172 lb 4 oz (78.1 kg)    Physical Exam Vitals signs and nursing note reviewed.  Constitutional:      General: She is awake.     Appearance: She is well-developed.  HENT:     Head: Normocephalic.  Eyes:     General:        Right eye: No discharge.        Left eye: No discharge.     Conjunctiva/sclera: Conjunctivae normal.     Pupils: Pupils are equal, round, and reactive to light.  Neck:     Musculoskeletal: Normal range of motion and neck supple.     Thyroid: No thyromegaly.     Vascular: No carotid bruit or JVD.  Cardiovascular:     Rate and Rhythm: Normal rate and regular rhythm.     Heart sounds:  Normal heart sounds.  Pulmonary:     Effort: Pulmonary effort is normal.     Breath sounds: Normal breath sounds.  Abdominal:     General: Bowel sounds are normal.     Palpations: Abdomen is soft.  Lymphadenopathy:     Cervical: No cervical adenopathy.  Skin:    General: Skin is warm and dry.  Neurological:     Mental Status: She is alert and oriented to person, place, and time.  Psychiatric:        Mood and Affect: Mood normal.        Behavior: Behavior normal. Behavior is cooperative.        Thought Content: Thought content normal.        Judgment: Judgment normal.      Results for orders placed or performed in visit on 06/10/17  Lipid Panel w/o Chol/HDL Ratio  Result Value Ref Range   Cholesterol, Total 176 100 - 199 mg/dL   Triglycerides 112 0 - 149 mg/dL   HDL 47 >39 mg/dL   VLDL Cholesterol Cal 22 5 - 40 mg/dL   LDL Calculated 107 (H) 0 - 99 mg/dL  TSH  Result Value Ref Range   TSH 1.470 0.450 - 4.500 uIU/mL      Assessment & Plan:   Problem List Items Addressed This Visit      Cardiovascular and Mediastinum   Hypertensive heart and kidney disease without HF and with CKD stage IV (HCC)    Chronic, stable.  BP below goal.  Continue current regimen and collaboration with [facility] nephrology.      Relevant Medications   NIFEdipine (PROCARDIA XL/NIFEDICAL-XL) 90 MG 24 hr tablet   metoprolol tartrate (LOPRESSOR) 100 MG tablet     Genitourinary   Chronic kidney disease, stage 4 (severe) (HCC)    Followed by [facility] nephrology.        Other   Hyperlipemia - Primary    Continue statin.  Lipid panel today.      Relevant Medications   NIFEdipine (PROCARDIA XL/NIFEDICAL-XL) 90 MG 24 hr tablet   metoprolol tartrate (LOPRESSOR) 100 MG tablet   Other Relevant Orders   Lipid Panel w/o Chol/HDL Ratio       Follow up plan: Return in about 6 months (around 09/12/2018) for HTN/HLD, CKD.

## 2018-03-13 NOTE — Assessment & Plan Note (Signed)
Followed by [facility] nephrology.

## 2018-03-14 LAB — LIPID PANEL W/O CHOL/HDL RATIO
CHOLESTEROL TOTAL: 167 mg/dL (ref 100–199)
HDL: 44 mg/dL (ref >39)
LDL Calculated: 103 mg/dL — ABNORMAL HIGH (ref 0–99)
TRIGLYCERIDES: 98 mg/dL (ref 0–149)
VLDL Cholesterol Cal: 20 mg/dL (ref 5–40)

## 2018-09-15 ENCOUNTER — Other Ambulatory Visit: Payer: Self-pay

## 2018-09-15 ENCOUNTER — Ambulatory Visit (INDEPENDENT_AMBULATORY_CARE_PROVIDER_SITE_OTHER): Payer: BLUE CROSS/BLUE SHIELD | Admitting: Nurse Practitioner

## 2018-09-15 ENCOUNTER — Encounter: Payer: Self-pay | Admitting: Nurse Practitioner

## 2018-09-15 VITALS — BP 132/80 | HR 67 | Temp 98.7°F

## 2018-09-15 DIAGNOSIS — I1310 Hypertensive heart and chronic kidney disease without heart failure, with stage 1 through stage 4 chronic kidney disease, or unspecified chronic kidney disease: Secondary | ICD-10-CM

## 2018-09-15 DIAGNOSIS — N184 Chronic kidney disease, stage 4 (severe): Secondary | ICD-10-CM

## 2018-09-15 DIAGNOSIS — E7800 Pure hypercholesterolemia, unspecified: Secondary | ICD-10-CM

## 2018-09-15 MED ORDER — CLOBETASOL PROPIONATE 0.05 % EX CREA: 1.0000 "application " | TOPICAL_CREAM | Freq: Two times a day (BID) | CUTANEOUS | 1 refills | Status: DC

## 2018-09-15 NOTE — Patient Instructions (Signed)
Fat and Cholesterol Restricted Eating Plan Getting too much fat and cholesterol in your diet may cause health problems. Choosing the right foods helps keep your fat and cholesterol at normal levels. This can keep you from getting certain diseases. Your doctor may recommend an eating plan that includes:  Total fat: ______% or less of total calories a day.  Saturated fat: ______% or less of total calories a day.  Cholesterol: less than _________mg a day.  Fiber: ______g a day. What are tips for following this plan? Meal planning  At meals, divide your plate into four equal parts: ? Fill one-half of your plate with vegetables and green salads. ? Fill one-fourth of your plate with whole grains. ? Fill one-fourth of your plate with low-fat (lean) protein foods.  Eat fish that is high in omega-3 fats at least two times a week. This includes mackerel, tuna, sardines, and salmon.  Eat foods that are high in fiber, such as whole grains, beans, apples, broccoli, carrots, peas, and barley. General tips   Work with your doctor to lose weight if you need to.  Avoid: ? Foods with added sugar. ? Fried foods. ? Foods with partially hydrogenated oils.  Limit alcohol intake to no more than 1 drink a day for nonpregnant women and 2 drinks a day for men. One drink equals 12 oz of beer, 5 oz of wine, or 1 oz of hard liquor. Reading food labels  Check food labels for: ? Trans fats. ? Partially hydrogenated oils. ? Saturated fat (g) in each serving. ? Cholesterol (mg) in each serving. ? Fiber (g) in each serving.  Choose foods with healthy fats, such as: ? Monounsaturated fats. ? Polyunsaturated fats. ? Omega-3 fats.  Choose grain products that have whole grains. Look for the word "whole" as the first word in the ingredient list. Cooking  Cook foods using low-fat methods. These include baking, boiling, grilling, and broiling.  Eat more home-cooked foods. Eat at restaurants and buffets  less often.  Avoid cooking using saturated fats, such as butter, cream, palm oil, palm kernel oil, and coconut oil. Recommended foods  Fruits  All fresh, canned (in natural juice), or frozen fruits. Vegetables  Fresh or frozen vegetables (raw, steamed, roasted, or grilled). Green salads. Grains  Whole grains, such as whole wheat or whole grain breads, crackers, cereals, and pasta. Unsweetened oatmeal, bulgur, barley, quinoa, or brown rice. Corn or whole wheat flour tortillas. Meats and other protein foods  Ground beef (85% or leaner), grass-fed beef, or beef trimmed of fat. Skinless chicken or turkey. Ground chicken or turkey. Pork trimmed of fat. All fish and seafood. Egg whites. Dried beans, peas, or lentils. Unsalted nuts or seeds. Unsalted canned beans. Nut butters without added sugar or oil. Dairy  Low-fat or nonfat dairy products, such as skim or 1% milk, 2% or reduced-fat cheeses, low-fat and fat-free ricotta or cottage cheese, or plain low-fat and nonfat yogurt. Fats and oils  Tub margarine without trans fats. Light or reduced-fat mayonnaise and salad dressings. Avocado. Olive, canola, sesame, or safflower oils. The items listed above may not be a complete list of foods and beverages you can eat. Contact a dietitian for more information. Foods to avoid Fruits  Canned fruit in heavy syrup. Fruit in cream or butter sauce. Fried fruit. Vegetables  Vegetables cooked in cheese, cream, or butter sauce. Fried vegetables. Grains  White bread. White pasta. White rice. Cornbread. Bagels, pastries, and croissants. Crackers and snack foods that contain trans fat   and hydrogenated oils. Meats and other protein foods  Fatty cuts of meat. Ribs, chicken wings, bacon, sausage, bologna, salami, chitterlings, fatback, hot dogs, bratwurst, and packaged lunch meats. Liver and organ meats. Whole eggs and egg yolks. Chicken and turkey with skin. Fried meat. Dairy  Whole or 2% milk, cream,  half-and-half, and cream cheese. Whole milk cheeses. Whole-fat or sweetened yogurt. Full-fat cheeses. Nondairy creamers and whipped toppings. Processed cheese, cheese spreads, and cheese curds. Beverages  Alcohol. Sugar-sweetened drinks such as sodas, lemonade, and fruit drinks. Fats and oils  Butter, stick margarine, lard, shortening, ghee, or bacon fat. Coconut, palm kernel, and palm oils. Sweets and desserts  Corn syrup, sugars, honey, and molasses. Candy. Jam and jelly. Syrup. Sweetened cereals. Cookies, pies, cakes, donuts, muffins, and ice cream. The items listed above may not be a complete list of foods and beverages you should avoid. Contact a dietitian for more information. Summary  Choosing the right foods helps keep your fat and cholesterol at normal levels. This can keep you from getting certain diseases.  At meals, fill one-half of your plate with vegetables and green salads.  Eat high-fiber foods, like whole grains, beans, apples, carrots, peas, and barley.  Limit added sugar, saturated fats, alcohol, and fried foods. This information is not intended to replace advice given to you by your health care provider. Make sure you discuss any questions you have with your health care provider. Document Released: 09/10/2011 Document Revised: 11/12/2017 Document Reviewed: 11/26/2016 Elsevier Interactive Patient Education  2019 [employer] Inc[employer]   

## 2018-09-15 NOTE — Assessment & Plan Note (Signed)
Chronic, stable with BP at goal today.  Continue current medication regimen and collaboration with nephrology.  Labs to be obtained at [facility].

## 2018-09-15 NOTE — Progress Notes (Signed)
BP 132/80 (BP Location: Left Arm)   Pulse 67   Temp 98.7 F (37.1 C) (Oral)   LMP  (LMP Unknown)   SpO2 98%    Subjective:    Patient ID: Emma Sullivan, female    DOB: 24-Apr-1960, 58 y.o.   MRN: [id number]  HPI: Emma Sullivan is a 58 y.o. female  Chief Complaint  Patient presents with  . Chronic Kidney Disease  . Hyperlipidemia  . Hypertension   HYPERTENSION / HYPERLIPIDEMIA Continues on Pravastatin and Metoprolol & Procardia. Satisfied with current treatment? yes Duration of hypertension: chronic BP monitoring frequency: not checking BP range:  BP medication side effects: no Duration of hyperlipidemia: chronic Cholesterol medication side effects: no Cholesterol supplements: none Medication compliance: good compliance Aspirin: no Recent stressors: no Recurrent headaches: no Visual changes: no Palpitations: no Dyspnea: no Chest pain: no Lower extremity edema: no Dizzy/lightheaded: no   CHRONIC KIDNEY DISEASE Followed by [facility] nephrology and last seen in August with labs done. Is due to see next month and will obtain labs there.   CKD status: stable Medications renally dose: yes Previous renal evaluation: yes Pneumovax:  Up to Date Influenza Vaccine:  Up to Date  Relevant past medical, surgical, family and social history reviewed and updated as indicated. Interim medical history since our last visit reviewed. Allergies and medications reviewed and updated.  Review of Systems  Constitutional: Negative for activity change, appetite change, diaphoresis, fatigue and fever.  Respiratory: Negative for cough, chest tightness and shortness of breath.   Cardiovascular: Negative for chest pain, palpitations and leg swelling.  Gastrointestinal: Negative for abdominal distention, abdominal pain, constipation, diarrhea, nausea and vomiting.  Endocrine: Negative for cold intolerance and heat intolerance.  Neurological: Negative for dizziness, syncope,  weakness, light-headedness, numbness and headaches.  Psychiatric/Behavioral: Negative.     Per HPI unless specifically indicated above     Objective:    BP 132/80 (BP Location: Left Arm)   Pulse 67   Temp 98.7 F (37.1 C) (Oral)   LMP  (LMP Unknown)   SpO2 98%   Wt Readings from Last 3 Encounters:  03/13/18 179 lb 3.2 oz (81.3 kg)  12/12/17 176 lb 4.8 oz (80 kg)  09/03/17 172 lb 4 oz (78.1 kg)    Physical Exam Vitals signs and nursing note reviewed.  Constitutional:      General: She is awake. She is not in acute distress.    Appearance: She is well-developed. She is not ill-appearing.  HENT:     Head: Normocephalic.     Right Ear: Hearing normal.     Left Ear: Hearing normal.     Nose: Nose normal.     Mouth/Throat:     Mouth: Mucous membranes are moist.  Eyes:     General: Lids are normal.        Right eye: No discharge.        Left eye: No discharge.     Conjunctiva/sclera: Conjunctivae normal.     Pupils: Pupils are equal, round, and reactive to light.  Neck:     Musculoskeletal: Normal range of motion and neck supple.     Thyroid: No thyromegaly.     Vascular: No carotid bruit.  Cardiovascular:     Rate and Rhythm: Normal rate and regular rhythm.     Heart sounds: Normal heart sounds. No murmur. No gallop.   Pulmonary:     Effort: Pulmonary effort is normal. No accessory muscle usage or respiratory distress.  Breath sounds: Normal breath sounds.  Abdominal:     General: Bowel sounds are normal.     Palpations: Abdomen is soft. There is no hepatomegaly or splenomegaly.  Musculoskeletal:     Right lower leg: No edema.     Left lower leg: No edema.  Lymphadenopathy:     Cervical: No cervical adenopathy.  Skin:    General: Skin is warm and dry.  Neurological:     Mental Status: She is alert and oriented to person, place, and time.  Psychiatric:        Attention and Perception: Attention normal.        Mood and Affect: Mood normal.        Behavior:  Behavior normal. Behavior is cooperative.        Thought Content: Thought content normal.        Judgment: Judgment normal.     Results for orders placed or performed in visit on 03/13/18  Lipid Panel w/o Chol/HDL Ratio  Result Value Ref Range   Cholesterol, Total 167 100 - 199 mg/dL   Triglycerides 98 0 - 149 mg/dL   HDL 44 >39 mg/dL   VLDL Cholesterol Cal 20 5 - 40 mg/dL   LDL Calculated 103 (H) 0 - 99 mg/dL      Assessment & Plan:   Problem List Items Addressed This Visit      Cardiovascular and Mediastinum   Hypertensive heart and kidney disease without HF and with CKD stage IV (HCC) - Primary    Chronic, stable with BP at goal today.  Continue current medication regimen and collaboration with nephrology.  Labs to be obtained at [facility].        Genitourinary   Chronic kidney disease, stage 4 (severe) ([place])    Followed by [facility] nephrology and due to see next month.        Other   Hyperlipemia    Chronic, stable.  Continue current medication regimen.  Labs today.      Relevant Orders   Lipid Panel w/o Chol/HDL Ratio   Hepatic function panel       Follow up plan: Return in about 6 months (around 03/17/2019) for HTN/HLD, CKD.

## 2018-09-15 NOTE — Assessment & Plan Note (Signed)
Followed by [facility] nephrology and due to see next month.

## 2018-09-15 NOTE — Assessment & Plan Note (Signed)
Chronic, stable.  Continue current medication regimen.  Labs today.

## 2018-09-16 LAB — LIPID PANEL W/O CHOL/HDL RATIO
Cholesterol, Total: 175 mg/dL (ref 100–199)
HDL: 42 mg/dL (ref >39)
LDL Calculated: 109 mg/dL — ABNORMAL HIGH (ref 0–99)
Triglycerides: 118 mg/dL (ref 0–149)
VLDL Cholesterol Cal: 24 mg/dL (ref 5–40)

## 2018-09-16 LAB — HEPATIC FUNCTION PANEL
ALT: 18 IU/L (ref 0–32)
AST: 18 IU/L (ref 0–40)
Albumin: 4.5 g/dL (ref 3.8–4.9)
Alkaline Phosphatase: 119 IU/L — ABNORMAL HIGH (ref 39–117)
Bilirubin Total: 0.4 mg/dL (ref 0.0–1.2)
Bilirubin, Direct: 0.14 mg/dL (ref 0.00–0.40)
Total Protein: 7 g/dL (ref 6.0–8.5)

## 2019-01-19 ENCOUNTER — Other Ambulatory Visit: Payer: Self-pay | Admitting: Unknown Physician Specialty

## 2019-01-19 NOTE — Telephone Encounter (Signed)
Prescription for pravastatin 10 mg expired on 12/12/18.

## 2019-01-20 NOTE — Telephone Encounter (Signed)
Routing to provider  

## 2019-03-09 ENCOUNTER — Other Ambulatory Visit: Payer: Self-pay | Admitting: Nurse Practitioner

## 2019-03-23 ENCOUNTER — Ambulatory Visit (INDEPENDENT_AMBULATORY_CARE_PROVIDER_SITE_OTHER): Payer: BLUE CROSS/BLUE SHIELD | Admitting: Nurse Practitioner

## 2019-03-23 ENCOUNTER — Encounter: Payer: Self-pay | Admitting: Nurse Practitioner

## 2019-03-23 ENCOUNTER — Other Ambulatory Visit: Payer: Self-pay

## 2019-03-23 DIAGNOSIS — E7800 Pure hypercholesterolemia, unspecified: Secondary | ICD-10-CM | POA: Diagnosis not present

## 2019-03-23 DIAGNOSIS — I1310 Hypertensive heart and chronic kidney disease without heart failure, with stage 1 through stage 4 chronic kidney disease, or unspecified chronic kidney disease: Secondary | ICD-10-CM

## 2019-03-23 DIAGNOSIS — N184 Chronic kidney disease, stage 4 (severe): Secondary | ICD-10-CM | POA: Diagnosis not present

## 2019-03-23 MED ORDER — NIFEDIPINE ER OSMOTIC RELEASE 90 MG PO TB24: 90.0000 mg | ORAL_TABLET | Freq: Every day | ORAL | 3 refills | Status: DC

## 2019-03-23 MED ORDER — PRAVASTATIN SODIUM 10 MG PO TABS: 10.0000 mg | ORAL_TABLET | Freq: Every day | ORAL | 3 refills | Status: DC

## 2019-03-23 MED ORDER — METOPROLOL TARTRATE 100 MG PO TABS: 100.0000 mg | ORAL_TABLET | Freq: Two times a day (BID) | ORAL | 3 refills | Status: DC

## 2019-03-23 NOTE — Assessment & Plan Note (Signed)
Chronic, stable.  Continue current medication regimen and adjust as needed.  Labs next visit, does not wish to come in for outpatient labs due to Covid.

## 2019-03-23 NOTE — Progress Notes (Signed)
LMP  (LMP Unknown)    Subjective:    Patient ID: Emma Sullivan, female    DOB: 03-Jul-1960, 58 y.o.   MRN: [id number]  HPI: Emma Sullivan is a 58 y.o. female  Chief Complaint  Patient presents with  . Hyperlipidemia  . Hypertension  . Chronic Kidney Disease    . This visit was completed via telephone due to the restrictions of the COVID-19 pandemic. All issues as above were discussed and addressed but no physical exam was performed. If it was felt that the patient should be evaluated in the office, they were directed there. The patient verbally consented to this visit. Patient was unable to complete an audio/visual visit due to Lack of equipment. Due to the catastrophic nature of the COVID-19 pandemic, this visit was done through audio contact only. . Location of the patient: home . Location of the provider: work . Those involved with this call:  . Provider: Marnee Guarneri, DNP . CMA: Yvonna Alanis, CMA . Front Desk/Registration: Jill Side  . Time spent on call: 15 minutes on the phone discussing health concerns. 10 minutes total spent in review of patient's record and preparation of their chart.  . I verified patient identity using two factors (patient name and date of birth). Patient consents verbally to being seen via telemedicine visit today.    HYPERTENSION / HYPERLIPIDEMIA Continues on Pravastatin and Metoprolol & Procardia. Satisfied with current treatment? yes Duration of hypertension: chronic BP monitoring frequency: not checking BP range:  BP medication side effects: no Duration of hyperlipidemia: chronic Cholesterol medication side effects: no Cholesterol supplements: none Medication compliance: good compliance Aspirin: no Recent stressors: no Recurrent headaches: no Visual changes: no Palpitations: no Dyspnea: no Chest pain: no Lower extremity edema: no Dizzy/lightheaded: no   CHRONIC KIDNEY DISEASE Followed by [facility] nephrology and  last seen in December with labs done. GFR 28 and CRT 1.94.  She reports no changes made. CKD status: stable Medications renally dose: yes Previous renal evaluation: yes Pneumovax:  Up to Date Influenza Vaccine:  Up to Date  Relevant past medical, surgical, family and social history reviewed and updated as indicated. Interim medical history since our last visit reviewed. Allergies and medications reviewed and updated.  Review of Systems  Constitutional: Negative for activity change, appetite change, diaphoresis, fatigue and fever.  Respiratory: Negative for cough, chest tightness, shortness of breath and wheezing.   Cardiovascular: Negative for chest pain, palpitations and leg swelling.  Gastrointestinal: Negative.   Endocrine: Negative.   Neurological: Negative.   Psychiatric/Behavioral: Negative.     Per HPI unless specifically indicated above     Objective:    LMP  (LMP Unknown)   Wt Readings from Last 3 Encounters:  03/13/18 179 lb 3.2 oz (81.3 kg)  12/12/17 176 lb 4.8 oz (80 kg)  09/03/17 172 lb 4 oz (78.1 kg)    Physical Exam   Unable to perform due to telephone visit only  Results for orders placed or performed in visit on 09/15/18  Lipid Panel w/o Chol/HDL Ratio  Result Value Ref Range   Cholesterol, Total 175 100 - 199 mg/dL   Triglycerides 118 0 - 149 mg/dL   HDL 42 >39 mg/dL   VLDL Cholesterol Cal 24 5 - 40 mg/dL   LDL Calculated 109 (H) 0 - 99 mg/dL  Hepatic function panel  Result Value Ref Range   Total Protein 7.0 6.0 - 8.5 g/dL   Albumin 4.5 3.8 - 4.9 g/dL   Bilirubin  Total 0.4 0.0 - 1.2 mg/dL   Bilirubin, Direct 0.14 0.00 - 0.40 mg/dL   Alkaline Phosphatase 119 (H) 39 - 117 IU/L   AST 18 0 - 40 IU/L   ALT 18 0 - 32 IU/L      Assessment & Plan:   Problem List Items Addressed This Visit      Cardiovascular and Mediastinum   Hypertensive heart and kidney disease without HF and with CKD stage IV (HCC) - Primary    Chronic, stable with BP at goal  on recent visits.  Continue current medication regimen and collaboration with nephrology.  Labs obtained at [facility] recently.  Recommend she check BP at home at least 3 mornings a week.      Relevant Medications   metoprolol tartrate (LOPRESSOR) 100 MG tablet   NIFEdipine (PROCARDIA XL/NIFEDICAL-XL) 90 MG 24 hr tablet   pravastatin (PRAVACHOL) 10 MG tablet     Genitourinary   Chronic kidney disease, stage 4 (severe) (HCC)    Followed by [facility] nephrology and recently seen, note reviewed.          Other   Hyperlipemia    Chronic, stable.  Continue current medication regimen and adjust as needed.  Labs next visit, does not wish to come in for outpatient labs due to Covid.      Relevant Medications   metoprolol tartrate (LOPRESSOR) 100 MG tablet   NIFEdipine (PROCARDIA XL/NIFEDICAL-XL) 90 MG 24 hr tablet   pravastatin (PRAVACHOL) 10 MG tablet      I discussed the assessment and treatment plan with the patient. The patient was provided an opportunity to ask questions and all were answered. The patient agreed with the plan and demonstrated an understanding of the instructions.   The patient was advised to call back or seek an in-person evaluation if the symptoms worsen or if the condition fails to improve as anticipated.   I provided 15 minutes of time during this encounter.  Follow up plan: Return in about 6 months (around 09/21/2019) for HTN/HLD and CKD.

## 2019-03-23 NOTE — Assessment & Plan Note (Signed)
Followed by [facility] nephrology and recently seen, note reviewed.   

## 2019-03-23 NOTE — Assessment & Plan Note (Signed)
Chronic, stable with BP at goal on recent visits.  Continue current medication regimen and collaboration with nephrology.  Labs obtained at [facility] recently.  Recommend she check BP at home at least 3 mornings a week.

## 2019-03-23 NOTE — Patient Instructions (Signed)

## 2019-09-15 ENCOUNTER — Telehealth (INDEPENDENT_AMBULATORY_CARE_PROVIDER_SITE_OTHER): Payer: 59 | Admitting: Nurse Practitioner

## 2019-09-15 ENCOUNTER — Encounter: Payer: Self-pay | Admitting: Nurse Practitioner

## 2019-09-15 DIAGNOSIS — I1310 Hypertensive heart and chronic kidney disease without heart failure, with stage 1 through stage 4 chronic kidney disease, or unspecified chronic kidney disease: Secondary | ICD-10-CM | POA: Diagnosis not present

## 2019-09-15 DIAGNOSIS — E7800 Pure hypercholesterolemia, unspecified: Secondary | ICD-10-CM

## 2019-09-15 DIAGNOSIS — N184 Chronic kidney disease, stage 4 (severe): Secondary | ICD-10-CM

## 2019-09-15 NOTE — Assessment & Plan Note (Signed)
Chronic, stable with BP at goal on recent visits.  Continue current medication regimen and collaboration with nephrology.  Labs obtained at [facility] recently.  Recommend she check BP at home at least 3 mornings a week and document for provider + focus on DASH diet.  Return in 6 months.

## 2019-09-15 NOTE — Progress Notes (Signed)
LMP  (LMP Unknown)    Subjective:    Patient ID: Emma Sullivan, female    DOB: [birth year], 59 y.o.   MRN: [id number]  HPI: Emma Sullivan is a 59 y.o. female  Chief Complaint  Patient presents with  . Hyperlipidemia  . Hypertension    . This visit was completed via MyChart due to the restrictions of the COVID-19 pandemic. All issues as above were discussed and addressed. Physical exam was done as above through visual confirmation on MyChart. If it was felt that the patient should be evaluated in the office, they were directed there. The patient verbally consented to this visit. . Location of the patient: home . Location of the provider: work . Those involved with this call:  . Provider: Marnee Guarneri, DNP . CMA: Yvonna Alanis, CMA . Front Desk/Registration: Don Perking  . Time spent on call: 20 minutes with patient face to face via video conference. More than 50% of this time was spent in counseling and coordination of care. 15 minutes total spent in review of patient's record and preparation of their chart.  . I verified patient identity using two factors (patient name and date of birth). Patient consents verbally to being seen via telemedicine visit today.    HYPERTENSION / HYPERLIPIDEMIA Continues on Pravastatin and Metoprolol & Procardia. Satisfied with current treatment?yes Duration of hypertension:chronic BP monitoring frequency:not checking BP range:  BP medication side effects:no Duration of hyperlipidemia:chronic Cholesterol medication side effects:no Cholesterol supplements: none Medication compliance:good compliance Aspirin:no Recent stressors:no Recurrent headaches:no Visual changes:no Palpitations:no Dyspnea:no Chest pain:no Lower extremity edema:no Dizzy/lightheaded:no  CHRONIC KIDNEY DISEASE Followed by [facility] nephrology and last seen 07/29/2019 with labs done. GFR 25 and CRT 2.06, BUN 32.  She reports no changes  made. CKD status:stable Medications renally dose:yes Previous renal evaluation:yes Pneumovax:Up to Date Influenza Vaccine:Up to Date  Relevant past medical, surgical, family and social history reviewed and updated as indicated. Interim medical history since our last visit reviewed. Allergies and medications reviewed and updated.  Review of Systems  Constitutional: Negative for activity change, appetite change, diaphoresis, fatigue and fever.  Respiratory: Negative for cough, chest tightness, shortness of breath and wheezing.   Cardiovascular: Negative for chest pain, palpitations and leg swelling.  Gastrointestinal: Negative.   Endocrine: Negative.   Neurological: Negative.   Psychiatric/Behavioral: Negative.     Per HPI unless specifically indicated above     Objective:    LMP  (LMP Unknown)   Wt Readings from Last 3 Encounters:  03/13/18 179 lb 3.2 oz (81.3 kg)  12/12/17 176 lb 4.8 oz (80 kg)  09/03/17 172 lb 4 oz (78.1 kg)    Physical Exam Vitals and nursing note reviewed.  Constitutional:      General: She is awake. She is not in acute distress.    Appearance: She is well-developed. She is not ill-appearing.  HENT:     Head: Normocephalic.     Right Ear: Hearing normal.     Left Ear: Hearing normal.  Eyes:     General: Lids are normal.        Right eye: No discharge.        Left eye: No discharge.     Conjunctiva/sclera: Conjunctivae normal.  Pulmonary:     Effort: Pulmonary effort is normal. No accessory muscle usage or respiratory distress.  Musculoskeletal:     Cervical back: Normal range of motion.  Neurological:     Mental Status: She is alert and oriented to person,  place, and time.  Psychiatric:        Attention and Perception: Attention normal.        Mood and Affect: Mood normal.        Behavior: Behavior normal. Behavior is cooperative.        Thought Content: Thought content normal.        Judgment: Judgment normal.     Results for  orders placed or performed in visit on 09/15/18  Lipid Panel w/o Chol/HDL Ratio  Result Value Ref Range   Cholesterol, Total 175 100 - 199 mg/dL   Triglycerides 118 0 - 149 mg/dL   HDL 42 >39 mg/dL   VLDL Cholesterol Cal 24 5 - 40 mg/dL   LDL Calculated 109 (H) 0 - 99 mg/dL  Hepatic function panel  Result Value Ref Range   Total Protein 7.0 6.0 - 8.5 g/dL   Albumin 4.5 3.8 - 4.9 g/dL   Bilirubin Total 0.4 0.0 - 1.2 mg/dL   Bilirubin, Direct 0.14 0.00 - 0.40 mg/dL   Alkaline Phosphatase 119 (H) 39 - 117 IU/L   AST 18 0 - 40 IU/L   ALT 18 0 - 32 IU/L      Assessment & Plan:   Problem List Items Addressed This Visit      Cardiovascular and Mediastinum   Hypertensive heart and kidney disease without HF and with CKD stage IV (HCC) - Primary    Chronic, stable with BP at goal on recent visits.  Continue current medication regimen and collaboration with nephrology.  Labs obtained at [facility] recently.  Recommend she check BP at home at least 3 mornings a week and document for provider + focus on DASH diet.  Return in 6 months.        Genitourinary   Chronic kidney disease, stage 4 (severe) ([place])    Followed by [facility] nephrology and recently seen, note reviewed.          Other   Hyperlipemia    Chronic, stable.  Continue current medication regimen and adjust as needed.  Obtain outpatient lipid panel.         I discussed the assessment and treatment plan with the patient. The patient was provided an opportunity to ask questions and all were answered. The patient agreed with the plan and demonstrated an understanding of the instructions.   The patient was advised to call back or seek an in-person evaluation if the symptoms worsen or if the condition fails to improve as anticipated.   I provided 21+ minutes of time during this encounter.  Follow up plan: Return in about 6 months (around 03/16/2020) for HTN/HLD, CKD.

## 2019-09-15 NOTE — Patient Instructions (Signed)
Preventing High Cholesterol Cholesterol is a white, waxy substance similar to fat that the human body needs to help build cells. The liver makes all the cholesterol that a person's body needs. Having high cholesterol (hypercholesterolemia) increases a person's risk for heart disease and stroke. Extra (excess) cholesterol comes from the food the person eats. High cholesterol can often be prevented with diet and lifestyle changes. If you already have high cholesterol, you can control it with diet and lifestyle changes and with medicine. How can high cholesterol affect me? If you have high cholesterol, deposits (plaques) may build up on the walls of your arteries. The arteries are the blood vessels that carry blood away from your heart. Plaques make the arteries narrower and stiffer. This can limit or block blood flow and cause blood clots to form. Blood clots:  Are tiny balls of cells that form in your blood.  Can move to the heart or brain, causing a heart attack or stroke. Plaques in arteries greatly increase your risk for heart attack and stroke.Making diet and lifestyle changes can reduce your risk for these conditions that may threaten your life. What can increase my risk? This condition is more likely to develop in people who:  Eat foods that are high in saturated fat or cholesterol. Saturated fat is mostly found in: ? Foods that contain animal fat, such as red meat and some dairy products. ? Certain fatty foods made from plants, such as tropical oils.  Are overweight.  Are not getting enough exercise.  Have a family history of high cholesterol. What actions can I take to prevent this? Nutrition   Eat less saturated fat.  Avoid trans fats (partially hydrogenated oils). These are often found in margarine and in some baked goods, fried foods, and snacks bought in packages.  Avoid precooked or cured meat, such as sausages or meat loaves.  Avoid foods and drinks that have added  sugars.  Eat more fruits, vegetables, and whole grains.  Choose healthy sources of protein, such as fish, poultry, lean cuts of red meat, beans, peas, lentils, and nuts.  Choose healthy sources of fat, such as: ? Nuts. ? Vegetable oils, especially olive oil. ? Fish that have healthy fats (omega-3 fatty acids), such as mackerel or salmon. The items listed above may not be a complete list of recommended foods and beverages. Contact a dietitian for more information. Lifestyle  Lose weight if you are overweight. Losing 5-10 lb (2.3-4.5 kg) can help prevent or control high cholesterol. It can also lower your risk for diabetes and high blood pressure. Ask your health care provider to help you with a diet and exercise plan to lose weight safely.  Do not use any products that contain nicotine or tobacco, such as cigarettes, e-cigarettes, and chewing tobacco. If you need help quitting, ask your health care provider.  Limit your alcohol intake. ? Do not drink alcohol if:  Your health care provider tells you not to drink.  You are pregnant, may be pregnant, or are planning to become pregnant. ? If you drink alcohol:  Limit how much you use to:  0-1 drink a day for women.  0-2 drinks a day for men.  Be aware of how much alcohol is in your drink. In the [place] one drink equals one 12 oz bottle of beer (355 mL), one 5 oz glass of wine (148 mL), or one 1 oz glass of hard liquor (44 mL). Activity   Get enough exercise. Each week, do at   least 150 minutes of exercise that takes a medium level of effort (moderate-intensity exercise). ? This is exercise that:  Makes your heart beat faster and makes you breathe harder than usual.  Allows you to still be able to talk. ? You could exercise in short sessions several times a day or longer sessions a few times a week. For example, on 5 days each week, you could walk fast or ride your bike 3 times a day for 10 minutes each time.  Do exercises as told  by your health care provider. Medicines  In addition to diet and lifestyle changes, your health care provider may recommend medicines to help lower cholesterol. This may be a medicine to lower the amount of cholesterol your liver makes. You may need medicine if: ? Diet and lifestyle changes do not lower your cholesterol enough. ? You have high cholesterol and other risk factors for heart disease or stroke.  Take over-the-counter and prescription medicines only as told by your health care provider. General information  Manage your risk factors for high cholesterol. Talk with your health care provider about all your risk factors and how to lower your risk.  Manage other conditions that you have, such as diabetes or high blood pressure (hypertension).  Have blood tests to check your cholesterol levels at regular points in time as told by your health care provider.  Keep all follow-up visits as told by your health care provider. This is important. Where to find more information  American Heart Association: www.heart.org  National Heart, Lung, and Blood Institute: www.nhlbi.nih.gov Summary  High cholesterol increases your risk for heart disease and stroke. By keeping your cholesterol level low, you can reduce your risk for these conditions.  High cholesterol can often be prevented with diet and lifestyle changes.  Work with your health care provider to manage your risk factors, and have your blood tested regularly. This information is not intended to replace advice given to you by your health care provider. Make sure you discuss any questions you have with your health care provider. Document Revised: 07/03/2018 Document Reviewed: 11/18/2015 Elsevier Patient Education  2020 [employer] Inc.  

## 2019-09-15 NOTE — Assessment & Plan Note (Signed)
Followed by [facility] nephrology and recently seen, note reviewed.

## 2019-09-15 NOTE — Assessment & Plan Note (Signed)
Chronic, stable.  Continue current medication regimen and adjust as needed.  Obtain outpatient lipid panel.

## 2020-03-15 ENCOUNTER — Ambulatory Visit: Payer: Self-pay | Admitting: Nurse Practitioner

## 2020-03-28 ENCOUNTER — Ambulatory Visit (INDEPENDENT_AMBULATORY_CARE_PROVIDER_SITE_OTHER): Payer: 59 | Admitting: Nurse Practitioner

## 2020-03-28 ENCOUNTER — Other Ambulatory Visit: Payer: Self-pay

## 2020-03-28 ENCOUNTER — Encounter: Payer: Self-pay | Admitting: Nurse Practitioner

## 2020-03-28 VITALS — BP 135/76 | HR 71 | Temp 98.3°F | Ht 61.34 in | Wt 191.4 lb

## 2020-03-28 DIAGNOSIS — I1310 Hypertensive heart and chronic kidney disease without heart failure, with stage 1 through stage 4 chronic kidney disease, or unspecified chronic kidney disease: Secondary | ICD-10-CM | POA: Diagnosis not present

## 2020-03-28 DIAGNOSIS — N184 Chronic kidney disease, stage 4 (severe): Secondary | ICD-10-CM | POA: Diagnosis not present

## 2020-03-28 DIAGNOSIS — E7800 Pure hypercholesterolemia, unspecified: Secondary | ICD-10-CM | POA: Diagnosis not present

## 2020-03-28 DIAGNOSIS — Z6835 Body mass index (BMI) 35.0-35.9, adult: Secondary | ICD-10-CM

## 2020-03-28 DIAGNOSIS — F418 Other specified anxiety disorders: Secondary | ICD-10-CM

## 2020-03-28 DIAGNOSIS — E669 Obesity, unspecified: Secondary | ICD-10-CM | POA: Insufficient documentation

## 2020-03-28 NOTE — Assessment & Plan Note (Signed)
BMI 35.77 with CKD and HTN.  Recommended eating smaller high protein, low fat meals more frequently and exercising 30 mins a day 5 times a week with a goal of 10-15lb weight loss in the next 3 months. Patient voiced their understanding and motivation to adhere to these recommendations.

## 2020-03-28 NOTE — Progress Notes (Signed)
BP 135/76   Pulse 71   Temp 98.3 F (36.8 C) (Oral)   Ht 5' 1.34" (1.558 m)   Wt 191 lb 6.4 oz (86.8 kg)   LMP  (LMP Unknown)   SpO2 98%   BMI 35.77 kg/m    Subjective:    Patient ID: Emma Sullivan, female    DOB: [birth year], 60 y.o.   MRN: [id number]  HPI: Emma Sullivan is a 60 y.o. female  Chief Complaint  Patient presents with  . Hyperlipidemia  . Hypertension  . Chronic Kidney Disease   HYPERTENSION / HYPERLIPIDEMIA Continues on Pravastatin and Metoprolol & Procardia. Satisfied with current treatment? yes Duration of hypertension: chronic BP monitoring frequency: not checking BP range:  BP medication side effects: no Duration of hyperlipidemia: chronic Cholesterol medication side effects: no Cholesterol supplements: none Medication compliance: good compliance Aspirin: no Recent stressors: no Recurrent headaches: no Visual changes: no Palpitations: no Dyspnea: no Chest pain: no Lower extremity edema: no Dizzy/lightheaded: no   CHRONIC KIDNEY DISEASE Followed by [facility] nephrology and last seen 07/29/19 with labs done -- GFR 25, CRT 2.06.  Is due to see them next month.  Continues on sodium bicarbonate. CKD status: stable Medications renally dose: yes Previous renal evaluation: yes Pneumovax:  Up to Date Influenza Vaccine:  Up to Date   Depression screen [facility] 2/9 03/28/2020  Decreased Interest 3  Down, Depressed, Hopeless 3  PHQ - 2 Score 6  Altered sleeping 0  Tired, decreased energy 2  Change in appetite 2  Feeling bad or failure about yourself  2  Trouble concentrating 2  Moving slowly or fidgety/restless 2  Suicidal thoughts 0  PHQ-9 Score 16   Relevant past medical, surgical, family and social history reviewed and updated as indicated. Interim medical history since our last visit reviewed. Allergies and medications reviewed and updated.  Review of Systems  Constitutional: Negative for activity change, appetite change, diaphoresis,  fatigue and fever.  Respiratory: Negative for cough, chest tightness and shortness of breath.   Cardiovascular: Negative for chest pain, palpitations and leg swelling.  Gastrointestinal: Negative for abdominal distention, abdominal pain, constipation, diarrhea, nausea and vomiting.  Endocrine: Negative for cold intolerance and heat intolerance.  Neurological: Negative for dizziness, syncope, weakness, light-headedness, numbness and headaches.  Psychiatric/Behavioral: Negative.     Per HPI unless specifically indicated above     Objective:    BP 135/76   Pulse 71   Temp 98.3 F (36.8 C) (Oral)   Ht 5' 1.34" (1.558 m)   Wt 191 lb 6.4 oz (86.8 kg)   LMP  (LMP Unknown)   SpO2 98%   BMI 35.77 kg/m   Wt Readings from Last 3 Encounters:  03/28/20 191 lb 6.4 oz (86.8 kg)  03/13/18 179 lb 3.2 oz (81.3 kg)  12/12/17 176 lb 4.8 oz (80 kg)    Physical Exam Vitals and nursing note reviewed.  Constitutional:      General: She is awake. She is not in acute distress.    Appearance: She is well-developed and well-groomed. She is obese. She is not ill-appearing or toxic-appearing.  HENT:     Head: Normocephalic.     Right Ear: Hearing normal.     Left Ear: Hearing normal.  Eyes:     General: Lids are normal.        Right eye: No discharge.        Left eye: No discharge.     Conjunctiva/sclera: Conjunctivae normal.  Pupils: Pupils are equal, round, and reactive to light.  Neck:     Thyroid: No thyromegaly.     Vascular: No carotid bruit.  Cardiovascular:     Rate and Rhythm: Normal rate and regular rhythm.     Heart sounds: Normal heart sounds. No murmur heard. No gallop.   Pulmonary:     Effort: Pulmonary effort is normal. No accessory muscle usage or respiratory distress.     Breath sounds: Normal breath sounds.  Abdominal:     General: Bowel sounds are normal.     Palpations: Abdomen is soft. There is no hepatomegaly or splenomegaly.  Musculoskeletal:     Cervical back:  Normal range of motion and neck supple.     Right lower leg: No edema.     Left lower leg: No edema.  Skin:    General: Skin is warm and dry.  Neurological:     Mental Status: She is alert and oriented to person, place, and time.  Psychiatric:        Attention and Perception: Attention normal.        Mood and Affect: Mood normal.        Behavior: Behavior normal. Behavior is cooperative.        Thought Content: Thought content normal.        Judgment: Judgment normal.    Results for orders placed or performed in visit on 09/15/18  Lipid Panel w/o Chol/HDL Ratio  Result Value Ref Range   Cholesterol, Total 175 100 - 199 mg/dL   Triglycerides 118 0 - 149 mg/dL   HDL 42 >39 mg/dL   VLDL Cholesterol Cal 24 5 - 40 mg/dL   LDL Calculated 109 (H) 0 - 99 mg/dL  Hepatic function panel  Result Value Ref Range   Total Protein 7.0 6.0 - 8.5 g/dL   Albumin 4.5 3.8 - 4.9 g/dL   Bilirubin Total 0.4 0.0 - 1.2 mg/dL   Bilirubin, Direct 0.14 0.00 - 0.40 mg/dL   Alkaline Phosphatase 119 (H) 39 - 117 IU/L   AST 18 0 - 40 IU/L   ALT 18 0 - 32 IU/L      Assessment & Plan:   Problem List Items Addressed This Visit      Cardiovascular and Mediastinum   Hypertensive heart and kidney disease without HF and with CKD stage IV (HCC) - Primary    Chronic, stable with BP at goal in office today.  Continue current medication regimen and collaboration with nephrology.  Labs obtained at [facility] and has upcoming visit with them, will reivew.  Recommend she check BP at home at least 3 mornings a week and document for provider + focus on DASH diet.  Return in 6 months.        Genitourinary   Chronic kidney disease, stage 4 (severe) ([place])    Followed by [facility] nephrology and has upcoming visit, note reviewed from May 2021 visit.          Other   Hyperlipemia    Chronic, stable.  Continue current medication regimen and adjust as needed.  Obtain lipid panel today.      Relevant Orders   Lipid Panel w/o  Chol/HDL Ratio   Depression with anxiety    Ongoing, stable.  Denies SI/HI.  No current medications and does not wish to start.  Continue to monitor.      Obesity    BMI 35.77 with CKD and HTN.  Recommended eating smaller high protein, low  fat meals more frequently and exercising 30 mins a day 5 times a week with a goal of 10-15lb weight loss in the next 3 months. Patient voiced their understanding and motivation to adhere to these recommendations.           Follow up plan: Return in about 6 months (around 09/25/2020) for HTN/HLD, CKD.

## 2020-03-28 NOTE — Assessment & Plan Note (Signed)
Ongoing, stable.  Denies SI/HI.  No current medications and does not wish to start.  Continue to monitor.

## 2020-03-28 NOTE — Assessment & Plan Note (Addendum)
Chronic, stable.  Continue current medication regimen and adjust as needed.  Obtain lipid panel today.

## 2020-03-28 NOTE — Patient Instructions (Signed)
DASH Eating Plan DASH stands for "Dietary Approaches to Stop Hypertension." The DASH eating plan is a healthy eating plan that has been shown to reduce high blood pressure (hypertension). It may also reduce your risk for type 2 diabetes, heart disease, and stroke. The DASH eating plan may also help with weight loss. What are tips for following this plan?  General guidelines  Avoid eating more than 2,300 mg (milligrams) of salt (sodium) a day. If you have hypertension, you may need to reduce your sodium intake to 1,500 mg a day.  Limit alcohol intake to no more than 1 drink a day for nonpregnant women and 2 drinks a day for men. One drink equals 12 oz of beer, 5 oz of wine, or 1 oz of hard liquor.  Work with your health care provider to maintain a healthy body weight or to lose weight. Ask what an ideal weight is for you.  Get at least 30 minutes of exercise that causes your heart to beat faster (aerobic exercise) most days of the week. Activities may include walking, swimming, or biking.  Work with your health care provider or diet and nutrition specialist (dietitian) to adjust your eating plan to your individual calorie needs. Reading food labels   Check food labels for the amount of sodium per serving. Choose foods with less than 5 percent of the Daily Value of sodium. Generally, foods with less than 300 mg of sodium per serving fit into this eating plan.  To find whole grains, look for the word "whole" as the first word in the ingredient list. Shopping  Buy products labeled as "low-sodium" or "no salt added."  Buy fresh foods. Avoid canned foods and premade or frozen meals. Cooking  Avoid adding salt when cooking. Use salt-free seasonings or herbs instead of table salt or sea salt. Check with your health care provider or pharmacist before using salt substitutes.  Do not fry foods. Cook foods using healthy methods such as baking, boiling, grilling, and broiling instead.  Cook with  heart-healthy oils, such as olive, canola, soybean, or sunflower oil. Meal planning  Eat a balanced diet that includes: ? 5 or more servings of fruits and vegetables each day. At each meal, try to fill half of your plate with fruits and vegetables. ? Up to 6-8 servings of whole grains each day. ? Less than 6 oz of lean meat, poultry, or fish each day. A 3-oz serving of meat is about the same size as a deck of cards. One egg equals 1 oz. ? 2 servings of low-fat dairy each day. ? A serving of nuts, seeds, or beans 5 times each week. ? Heart-healthy fats. Healthy fats called Omega-3 fatty acids are found in foods such as flaxseeds and coldwater fish, like sardines, salmon, and mackerel.  Limit how much you eat of the following: ? Canned or prepackaged foods. ? Food that is high in trans fat, such as fried foods. ? Food that is high in saturated fat, such as fatty meat. ? Sweets, desserts, sugary drinks, and other foods with added sugar. ? Full-fat dairy products.  Do not salt foods before eating.  Try to eat at least 2 vegetarian meals each week.  Eat more home-cooked food and less restaurant, buffet, and fast food.  When eating at a restaurant, ask that your food be prepared with less salt or no salt, if possible. What foods are recommended? The items listed may not be a complete list. Talk with your dietitian about   what dietary choices are best for you. Grains Whole-grain or whole-wheat bread. Whole-grain or whole-wheat pasta. Brown rice. Oatmeal. Quinoa. Bulgur. Whole-grain and low-sodium cereals. Pita bread. Low-fat, low-sodium crackers. Whole-wheat flour tortillas. Vegetables Fresh or frozen vegetables (raw, steamed, roasted, or grilled). Low-sodium or reduced-sodium tomato and vegetable juice. Low-sodium or reduced-sodium tomato sauce and tomato paste. Low-sodium or reduced-sodium canned vegetables. Fruits All fresh, dried, or frozen fruit. Canned fruit in natural juice (without  added sugar). Meat and other protein foods Skinless chicken or turkey. Ground chicken or turkey. Pork with fat trimmed off. Fish and seafood. Egg whites. Dried beans, peas, or lentils. Unsalted nuts, nut butters, and seeds. Unsalted canned beans. Lean cuts of beef with fat trimmed off. Low-sodium, lean deli meat. Dairy Low-fat (1%) or fat-free (skim) milk. Fat-free, low-fat, or reduced-fat cheeses. Nonfat, low-sodium ricotta or cottage cheese. Low-fat or nonfat yogurt. Low-fat, low-sodium cheese. Fats and oils Soft margarine without trans fats. Vegetable oil. Low-fat, reduced-fat, or light mayonnaise and salad dressings (reduced-sodium). Canola, safflower, olive, soybean, and sunflower oils. Avocado. Seasoning and other foods Herbs. Spices. Seasoning mixes without salt. Unsalted popcorn and pretzels. Fat-free sweets. What foods are not recommended? The items listed may not be a complete list. Talk with your dietitian about what dietary choices are best for you. Grains Baked goods made with fat, such as croissants, muffins, or some breads. Dry pasta or rice meal packs. Vegetables Creamed or fried vegetables. Vegetables in a cheese sauce. Regular canned vegetables (not low-sodium or reduced-sodium). Regular canned tomato sauce and paste (not low-sodium or reduced-sodium). Regular tomato and vegetable juice (not low-sodium or reduced-sodium). Pickles. Olives. Fruits Canned fruit in a light or heavy syrup. Fried fruit. Fruit in cream or butter sauce. Meat and other protein foods Fatty cuts of meat. Ribs. Fried meat. Bacon. Sausage. Bologna and other processed lunch meats. Salami. Fatback. Hotdogs. Bratwurst. Salted nuts and seeds. Canned beans with added salt. Canned or smoked fish. Whole eggs or egg yolks. Chicken or turkey with skin. Dairy Whole or 2% milk, cream, and half-and-half. Whole or full-fat cream cheese. Whole-fat or sweetened yogurt. Full-fat cheese. Nondairy creamers. Whipped toppings.  Processed cheese and cheese spreads. Fats and oils Butter. Stick margarine. Lard. Shortening. Ghee. Bacon fat. Tropical oils, such as coconut, palm kernel, or palm oil. Seasoning and other foods Salted popcorn and pretzels. Onion salt, garlic salt, seasoned salt, table salt, and sea salt. Worcestershire sauce. Tartar sauce. Barbecue sauce. Teriyaki sauce. Soy sauce, including reduced-sodium. Steak sauce. Canned and packaged gravies. Fish sauce. Oyster sauce. Cocktail sauce. Horseradish that you find on the shelf. Ketchup. Mustard. Meat flavorings and tenderizers. Bouillon cubes. Hot sauce and Tabasco sauce. Premade or packaged marinades. Premade or packaged taco seasonings. Relishes. Regular salad dressings. Where to find more information:  National Heart, Lung, and Blood Institute: www.nhlbi.nih.gov  American Heart Association: www.heart.org Summary  The DASH eating plan is a healthy eating plan that has been shown to reduce high blood pressure (hypertension). It may also reduce your risk for type 2 diabetes, heart disease, and stroke.  With the DASH eating plan, you should limit salt (sodium) intake to 2,300 mg a day. If you have hypertension, you may need to reduce your sodium intake to 1,500 mg a day.  When on the DASH eating plan, aim to eat more fresh fruits and vegetables, whole grains, lean proteins, low-fat dairy, and heart-healthy fats.  Work with your health care provider or diet and nutrition specialist (dietitian) to adjust your eating plan to your   individual calorie needs. This information is not intended to replace advice given to you by your health care provider. Make sure you discuss any questions you have with your health care provider. Document Revised: 02/21/2017 Document Reviewed: 03/04/2016 Elsevier Patient Education  2020 Elsevier Inc.  

## 2020-03-28 NOTE — Assessment & Plan Note (Signed)
Chronic, stable with BP at goal in office today.  Continue current medication regimen and collaboration with nephrology.  Labs obtained at [facility] and has upcoming visit with them, will reivew.  Recommend she check BP at home at least 3 mornings a week and document for provider + focus on DASH diet.  Return in 6 months.

## 2020-03-28 NOTE — Assessment & Plan Note (Signed)
Followed by [facility] nephrology and has upcoming visit, note reviewed from May 2021 visit.

## 2020-03-29 LAB — LIPID PANEL W/O CHOL/HDL RATIO
Cholesterol, Total: 177 mg/dL (ref 100–199)
HDL: 36 mg/dL — ABNORMAL LOW (ref >39)
LDL Chol Calc (NIH): 116 mg/dL — ABNORMAL HIGH (ref 0–99)
Triglycerides: 139 mg/dL (ref 0–149)
VLDL Cholesterol Cal: 25 mg/dL (ref 5–40)

## 2020-03-29 NOTE — Progress Notes (Signed)
Good afternoon, please let Emma Sullivan know her cholesterol labs have returned and LDL remains elevated above goal at 116, goal is less than 70.  I would like to increase her Pravastatin to 20 MG daily if this is okay with her.  If so I will send in new prescription.  Please let me know.  If we increase then we will recheck labs next visit.  Have  great day!! Keep being awesome!!  Thank you for allowing me to participate in your care. Kindest regards, Zareth Rippetoe

## 2020-03-30 ENCOUNTER — Other Ambulatory Visit: Payer: Self-pay | Admitting: Nurse Practitioner

## 2020-03-30 MED ORDER — PRAVASTATIN SODIUM 20 MG PO TABS: 20.0000 mg | ORAL_TABLET | Freq: Every day | ORAL | 3 refills | Status: DC

## 2020-05-31 ENCOUNTER — Other Ambulatory Visit: Payer: Self-pay | Admitting: Nurse Practitioner

## 2020-08-30 ENCOUNTER — Other Ambulatory Visit: Payer: Self-pay | Admitting: Nurse Practitioner

## 2020-09-26 ENCOUNTER — Ambulatory Visit: Payer: 59 | Admitting: Nurse Practitioner

## 2020-11-01 ENCOUNTER — Encounter: Payer: Self-pay | Admitting: Nurse Practitioner

## 2020-11-01 ENCOUNTER — Other Ambulatory Visit: Payer: Self-pay

## 2020-11-01 ENCOUNTER — Ambulatory Visit (INDEPENDENT_AMBULATORY_CARE_PROVIDER_SITE_OTHER): Payer: 59 | Admitting: Nurse Practitioner

## 2020-11-01 VITALS — BP 133/73 | HR 62 | Temp 98.8°F | Wt 187.0 lb

## 2020-11-01 DIAGNOSIS — Z6835 Body mass index (BMI) 35.0-35.9, adult: Secondary | ICD-10-CM

## 2020-11-01 DIAGNOSIS — E7800 Pure hypercholesterolemia, unspecified: Secondary | ICD-10-CM

## 2020-11-01 DIAGNOSIS — N184 Chronic kidney disease, stage 4 (severe): Secondary | ICD-10-CM | POA: Diagnosis not present

## 2020-11-01 DIAGNOSIS — F418 Other specified anxiety disorders: Secondary | ICD-10-CM

## 2020-11-01 DIAGNOSIS — I1310 Hypertensive heart and chronic kidney disease without heart failure, with stage 1 through stage 4 chronic kidney disease, or unspecified chronic kidney disease: Secondary | ICD-10-CM | POA: Diagnosis not present

## 2020-11-01 NOTE — Patient Instructions (Signed)
https://www.nhlbi.nih.gov/files/docs/public/heart/dash_brief.pdf">  DASH Eating Plan DASH stands for Dietary Approaches to Stop Hypertension. The DASH eating plan is a healthy eating plan that has been shown to: Reduce high blood pressure (hypertension). Reduce your risk for type 2 diabetes, heart disease, and stroke. Help with weight loss. What are tips for following this plan? Reading food labels Check food labels for the amount of salt (sodium) per serving. Choose foods with less than 5 percent of the Daily Value of sodium. Generally, foods with less than 300 milligrams (mg) of sodium per serving fit into this eating plan. To find whole grains, look for the word "whole" as the first word in the ingredient list. Shopping Buy products labeled as "low-sodium" or "no salt added." Buy fresh foods. Avoid canned foods and pre-made or frozen meals. Cooking Avoid adding salt when cooking. Use salt-free seasonings or herbs instead of table salt or sea salt. Check with your health care provider or pharmacist before using salt substitutes. Do not fry foods. Cook foods using healthy methods such as baking, boiling, grilling, roasting, and broiling instead. Cook with heart-healthy oils, such as olive, canola, avocado, soybean, or sunflower oil. Meal planning  Eat a balanced diet that includes: 4 or more servings of fruits and 4 or more servings of vegetables each day. Try to fill one-half of your plate with fruits and vegetables. 6-8 servings of whole grains each day. Less than 6 oz (170 g) of lean meat, poultry, or fish each day. A 3-oz (85-g) serving of meat is about the same size as a deck of cards. One egg equals 1 oz (28 g). 2-3 servings of low-fat dairy each day. One serving is 1 cup (237 mL). 1 serving of nuts, seeds, or beans 5 times each week. 2-3 servings of heart-healthy fats. Healthy fats called omega-3 fatty acids are found in foods such as walnuts, flaxseeds, fortified milks, and eggs.  These fats are also found in cold-water fish, such as sardines, salmon, and mackerel. Limit how much you eat of: Canned or prepackaged foods. Food that is high in trans fat, such as some fried foods. Food that is high in saturated fat, such as fatty meat. Desserts and other sweets, sugary drinks, and other foods with added sugar. Full-fat dairy products. Do not salt foods before eating. Do not eat more than 4 egg yolks a week. Try to eat at least 2 vegetarian meals a week. Eat more home-cooked food and less restaurant, buffet, and fast food.  Lifestyle When eating at a restaurant, ask that your food be prepared with less salt or no salt, if possible. If you drink alcohol: Limit how much you use to: 0-1 drink a day for women who are not pregnant. 0-2 drinks a day for men. Be aware of how much alcohol is in your drink. In the [place] one drink equals one 12 oz bottle of beer (355 mL), one 5 oz glass of wine (148 mL), or one 1 oz glass of hard liquor (44 mL). General information Avoid eating more than 2,300 mg of salt a day. If you have hypertension, you may need to reduce your sodium intake to 1,500 mg a day. Work with your health care provider to maintain a healthy body weight or to lose weight. Ask what an ideal weight is for you. Get at least 30 minutes of exercise that causes your heart to beat faster (aerobic exercise) most days of the week. Activities may include walking, swimming, or biking. Work with your health care provider   or dietitian to adjust your eating plan to your individual calorie needs. What foods should I eat? Fruits All fresh, dried, or frozen fruit. Canned fruit in natural juice (without addedsugar). Vegetables Fresh or frozen vegetables (raw, steamed, roasted, or grilled). Low-sodium or reduced-sodium tomato and vegetable juice. Low-sodium or reduced-sodium tomatosauce and tomato paste. Low-sodium or reduced-sodium canned vegetables. Grains Whole-grain or  whole-wheat bread. Whole-grain or whole-wheat pasta. Brown rice. Oatmeal. Quinoa. Bulgur. Whole-grain and low-sodium cereals. Pita bread.Low-fat, low-sodium crackers. Whole-wheat flour tortillas. Meats and other proteins Skinless chicken or turkey. Ground chicken or turkey. Pork with fat trimmed off. Fish and seafood. Egg whites. Dried beans, peas, or lentils. Unsalted nuts, nut butters, and seeds. Unsalted canned beans. Lean cuts of beef with fat trimmed off. Low-sodium, lean precooked or cured meat, such as sausages or meatloaves. Dairy Low-fat (1%) or fat-free (skim) milk. Reduced-fat, low-fat, or fat-free cheeses. Nonfat, low-sodium ricotta or cottage cheese. Low-fat or nonfatyogurt. Low-fat, low-sodium cheese. Fats and oils Soft margarine without trans fats. Vegetable oil. Reduced-fat, low-fat, or light mayonnaise and salad dressings (reduced-sodium). Canola, safflower, olive, avocado, soybean, andsunflower oils. Avocado. Seasonings and condiments Herbs. Spices. Seasoning mixes without salt. Other foods Unsalted popcorn and pretzels. Fat-free sweets. The items listed above may not be a complete list of foods and beverages you can eat. Contact a dietitian for more information. What foods should I avoid? Fruits Canned fruit in a light or heavy syrup. Fried fruit. Fruit in cream or buttersauce. Vegetables Creamed or fried vegetables. Vegetables in a cheese sauce. Regular canned vegetables (not low-sodium or reduced-sodium). Regular canned tomato sauce and paste (not low-sodium or reduced-sodium). Regular tomato and vegetable juice(not low-sodium or reduced-sodium). Pickles. Olives. Grains Baked goods made with fat, such as croissants, muffins, or some breads. Drypasta or rice meal packs. Meats and other proteins Fatty cuts of meat. Ribs. Fried meat. Bacon. Bologna, salami, and other precooked or cured meats, such as sausages or meat loaves. Fat from the back of a pig (fatback). Bratwurst.  Salted nuts and seeds. Canned beans with added salt. Canned orsmoked fish. Whole eggs or egg yolks. Chicken or turkey with skin. Dairy Whole or 2% milk, cream, and half-and-half. Whole or full-fat cream cheese. Whole-fat or sweetened yogurt. Full-fat cheese. Nondairy creamers. Whippedtoppings. Processed cheese and cheese spreads. Fats and oils Butter. Stick margarine. Lard. Shortening. Ghee. Bacon fat. Tropical oils, suchas coconut, palm kernel, or palm oil. Seasonings and condiments Onion salt, garlic salt, seasoned salt, table salt, and sea salt. Worcestershire sauce. Tartar sauce. Barbecue sauce. Teriyaki sauce. Soy sauce, including reduced-sodium. Steak sauce. Canned and packaged gravies. Fish sauce. Oyster sauce. Cocktail sauce. Store-bought horseradish. Ketchup. Mustard. Meat flavorings and tenderizers. Bouillon cubes. Hot sauces. Pre-made or packaged marinades. Pre-made or packaged taco seasonings. Relishes. Regular saladdressings. Other foods Salted popcorn and pretzels. The items listed above may not be a complete list of foods and beverages you should avoid. Contact a dietitian for more information. Where to find more information National Heart, Lung, and Blood Institute: www.nhlbi.nih.gov American Heart Association: www.heart.org Academy of Nutrition and Dietetics: www.eatright.org National Kidney Foundation: www.kidney.org Summary The DASH eating plan is a healthy eating plan that has been shown to reduce high blood pressure (hypertension). It may also reduce your risk for type 2 diabetes, heart disease, and stroke. When on the DASH eating plan, aim to eat more fresh fruits and vegetables, whole grains, lean proteins, low-fat dairy, and heart-healthy fats. With the DASH eating plan, you should limit salt (sodium) intake to 2,300   mg a day. If you have hypertension, you may need to reduce your sodium intake to 1,500 mg a day. Work with your health care provider or dietitian to adjust  your eating plan to your individual calorie needs. This information is not intended to replace advice given to you by your health care provider. Make sure you discuss any questions you have with your healthcare provider. Document Revised: 02/12/2019 Document Reviewed: 02/12/2019 Elsevier Patient Education  2022 Elsevier Inc.  

## 2020-11-01 NOTE — Assessment & Plan Note (Signed)
Followed by [facility] nephrology with recent visit, they have requested labs be obtained by PCP and sent to them -- will obtain CBC, CMP, Phosphorous, Vit D, PTH per their request.

## 2020-11-01 NOTE — Assessment & Plan Note (Signed)
Ongoing, stable.  Denies SI/HI.  No current medications and does not wish to start, refuses.  Continue to monitor.

## 2020-11-01 NOTE — Assessment & Plan Note (Signed)
Chronic, stable with BP at goal in office today.  Continue current medication regimen and collaboration with nephrology.  Recent [facility] note reviewed and they have requested labs be obtained by PCP and sent to them.  Recommend she check BP at home at least 3 mornings a week and document for provider + focus on DASH diet.  Return in 6 months.

## 2020-11-01 NOTE — Assessment & Plan Note (Signed)
BMI 34.94.  Recommended eating smaller high protein, low fat meals more frequently and exercising 30 mins a day 5 times a week with a goal of 10-15lb weight loss in the next 3 months. Patient voiced their understanding and motivation to adhere to these recommendations.

## 2020-11-01 NOTE — Progress Notes (Signed)
BP 133/73   Pulse 62   Temp 98.8 F (37.1 C) (Oral)   Wt 187 lb (84.8 kg)   LMP  (LMP Unknown)   SpO2 96%   BMI 34.94 kg/m    Subjective:    Patient ID: Emma Sullivan, female    DOB: [birth year], 60 y.o.   MRN: [id number]  HPI: Emma Sullivan is a 60 y.o. female  Chief Complaint  Patient presents with   Chronic Kidney Disease   Hyperlipidemia   Hypertension   Depression   HYPERTENSION / HYPERLIPIDEMIA Continues on Pravastatin and Metoprolol & Procardia + Sodium Bicarb. Satisfied with current treatment? yes Duration of hypertension: chronic BP monitoring frequency: not checking BP range:  BP medication side effects: no Duration of hyperlipidemia: chronic Cholesterol medication side effects: no Cholesterol supplements: none Medication compliance: good compliance Aspirin: no Recent stressors: no Recurrent headaches: no Visual changes: no Palpitations: no Dyspnea: no Chest pain: no Lower extremity edema: no Dizzy/lightheaded: no   CHRONIC KIDNEY DISEASE Followed by [facility] nephrology and last seen 10/17/20 and they have requested PCP to draw labs and send to them.  Is due to see them next month.  Continues on sodium bicarbonate. CKD status: stable Medications renally dose: yes Previous renal evaluation: yes Pneumovax:  Up to Date Influenza Vaccine:  Up to Date   Depression screen [facility] 2/9 11/01/2020 03/28/2020  Decreased Interest 3 3  Down, Depressed, Hopeless 3 3  PHQ - 2 Score 6 6  Altered sleeping 1 0  Tired, decreased energy 1 2  Change in appetite 3 2  Feeling bad or failure about yourself  3 2  Trouble concentrating 1 2  Moving slowly or fidgety/restless 1 2  Suicidal thoughts 3 0  PHQ-9 Score 19 16  Difficult doing work/chores Not difficult at all -   Relevant past medical, surgical, family and social history reviewed and updated as indicated. Interim medical history since our last visit reviewed. Allergies and medications reviewed and  updated.  Review of Systems  Constitutional:  Negative for activity change, appetite change, diaphoresis, fatigue and fever.  Respiratory:  Negative for cough, chest tightness and shortness of breath.   Cardiovascular:  Negative for chest pain, palpitations and leg swelling.  Gastrointestinal:  Negative for abdominal distention, abdominal pain, constipation, diarrhea, nausea and vomiting.  Endocrine: Negative for cold intolerance and heat intolerance.  Neurological:  Negative for dizziness, syncope, weakness, light-headedness, numbness and headaches.  Psychiatric/Behavioral: Negative.     Per HPI unless specifically indicated above     Objective:    BP 133/73   Pulse 62   Temp 98.8 F (37.1 C) (Oral)   Wt 187 lb (84.8 kg)   LMP  (LMP Unknown)   SpO2 96%   BMI 34.94 kg/m   Wt Readings from Last 3 Encounters:  11/01/20 187 lb (84.8 kg)  03/28/20 191 lb 6.4 oz (86.8 kg)  03/13/18 179 lb 3.2 oz (81.3 kg)    Physical Exam Vitals and nursing note reviewed.  Constitutional:      General: She is awake. She is not in acute distress.    Appearance: She is well-developed and well-groomed. She is obese. She is not ill-appearing or toxic-appearing.  HENT:     Head: Normocephalic.     Right Ear: Hearing normal.     Left Ear: Hearing normal.  Eyes:     General: Lids are normal.        Right eye: No discharge.  Left eye: No discharge.     Conjunctiva/sclera: Conjunctivae normal.     Pupils: Pupils are equal, round, and reactive to light.  Neck:     Thyroid: No thyromegaly.     Vascular: No carotid bruit.  Cardiovascular:     Rate and Rhythm: Normal rate and regular rhythm.     Heart sounds: Normal heart sounds. No murmur heard.   No gallop.  Pulmonary:     Effort: Pulmonary effort is normal. No accessory muscle usage or respiratory distress.     Breath sounds: Normal breath sounds.  Abdominal:     General: Bowel sounds are normal.     Palpations: Abdomen is soft. There  is no hepatomegaly or splenomegaly.  Musculoskeletal:     Cervical back: Normal range of motion and neck supple.     Right lower leg: No edema.     Left lower leg: No edema.  Skin:    General: Skin is warm and dry.  Neurological:     Mental Status: She is alert and oriented to person, place, and time.  Psychiatric:        Attention and Perception: Attention normal.        Mood and Affect: Mood normal.        Behavior: Behavior normal. Behavior is cooperative.        Thought Content: Thought content normal.        Judgment: Judgment normal.   Results for orders placed or performed in visit on 03/28/20  Lipid Panel w/o Chol/HDL Ratio  Result Value Ref Range   Cholesterol, Total 177 100 - 199 mg/dL   Triglycerides 139 0 - 149 mg/dL   HDL 36 (L) >39 mg/dL   VLDL Cholesterol Cal 25 5 - 40 mg/dL   LDL Chol Calc (NIH) 116 (H) 0 - 99 mg/dL      Assessment & Plan:   Problem List Items Addressed This Visit       Cardiovascular and Mediastinum   Hypertensive heart and kidney disease without HF and with CKD stage IV (HCC) - Primary    Chronic, stable with BP at goal in office today.  Continue current medication regimen and collaboration with nephrology.  Recent [facility] note reviewed and they have requested labs be obtained by PCP and sent to them.  Recommend she check BP at home at least 3 mornings a week and document for provider + focus on DASH diet.  Return in 6 months.       Relevant Orders   CBC with Differential/Platelet   Comprehensive metabolic panel   TSH     Genitourinary   Chronic kidney disease, stage 4 (severe) (HCC)    Followed by [facility] nephrology with recent visit, they have requested labs be obtained by PCP and sent to them -- will obtain CBC, CMP, Phosphorous, Vit D, PTH per their request.       Relevant Orders   CBC with Differential/Platelet   Comprehensive metabolic panel   VITAMIN D 25 Hydroxy (Vit-D Deficiency, Fractures)   PTH, Intact and Calcium   Phosphorus      Other   Hyperlipemia    Chronic, stable.  Continue current medication regimen and adjust as needed.  Obtain lipid panel today.       Relevant Orders   Lipid Panel w/o Chol/HDL Ratio   Depression with anxiety    Ongoing, stable.  Denies SI/HI.  No current medications and does not wish to start, refuses.  Continue to monitor.  Obesity    BMI 34.94.  Recommended eating smaller high protein, low fat meals more frequently and exercising 30 mins a day 5 times a week with a goal of 10-15lb weight loss in the next 3 months. Patient voiced their understanding and motivation to adhere to these recommendations.        Relevant Medications   sodium bicarbonate 650 MG tablet     Follow up plan: Return in about 6 months (around 05/04/2021) for HTN/HLD, CKD, MOOD.

## 2020-11-01 NOTE — Assessment & Plan Note (Signed)
Chronic, stable.  Continue current medication regimen and adjust as needed.  Obtain lipid panel today.

## 2020-11-02 ENCOUNTER — Other Ambulatory Visit: Payer: Self-pay | Admitting: Nurse Practitioner

## 2020-11-02 LAB — COMPREHENSIVE METABOLIC PANEL
ALT: 16 IU/L (ref 0–32)
AST: 15 IU/L (ref 0–40)
Albumin/Globulin Ratio: 2 (ref 1.2–2.2)
Albumin: 4.4 g/dL (ref 3.8–4.8)
Alkaline Phosphatase: 147 IU/L — ABNORMAL HIGH (ref 44–121)
BUN/Creatinine Ratio: 16 (ref 12–28)
BUN: 36 mg/dL — ABNORMAL HIGH (ref 8–27)
Bilirubin Total: 0.3 mg/dL (ref 0.0–1.2)
CO2: 21 mmol/L (ref 20–29)
Calcium: 9.6 mg/dL (ref 8.7–10.3)
Chloride: 99 mmol/L (ref 96–106)
Creatinine, Ser: 2.31 mg/dL — ABNORMAL HIGH (ref 0.57–1.00)
Globulin, Total: 2.2 g/dL (ref 1.5–4.5)
Glucose: 96 mg/dL (ref 65–99)
Potassium: 4.2 mmol/L (ref 3.5–5.2)
Sodium: 136 mmol/L (ref 134–144)
Total Protein: 6.6 g/dL (ref 6.0–8.5)
eGFR: 23 mL/min/{1.73_m2} — ABNORMAL LOW (ref >59)

## 2020-11-02 LAB — CBC WITH DIFFERENTIAL/PLATELET
Basophils Absolute: 0.1 10*3/uL (ref 0.0–0.2)
Basos: 1 %
EOS (ABSOLUTE): 0.3 10*3/uL (ref 0.0–0.4)
Eos: 3 %
Hematocrit: 45.3 % (ref 34.0–46.6)
Hemoglobin: 15.5 g/dL (ref 11.1–15.9)
Immature Grans (Abs): 0 10*3/uL (ref 0.0–0.1)
Immature Granulocytes: 0 %
Lymphocytes Absolute: 1.7 10*3/uL (ref 0.7–3.1)
Lymphs: 18 %
MCH: 29.4 pg (ref 26.6–33.0)
MCHC: 34.2 g/dL (ref 31.5–35.7)
MCV: 86 fL (ref 79–97)
Monocytes Absolute: 0.5 10*3/uL (ref 0.1–0.9)
Monocytes: 5 %
Neutrophils Absolute: 6.8 10*3/uL (ref 1.4–7.0)
Neutrophils: 73 %
Platelets: 207 10*3/uL (ref 150–450)
RBC: 5.27 x10E6/uL (ref 3.77–5.28)
RDW: 13.4 % (ref 11.7–15.4)
WBC: 9.4 10*3/uL (ref 3.4–10.8)

## 2020-11-02 LAB — LIPID PANEL W/O CHOL/HDL RATIO
Cholesterol, Total: 167 mg/dL (ref 100–199)
HDL: 33 mg/dL — ABNORMAL LOW (ref >39)
LDL Chol Calc (NIH): 103 mg/dL — ABNORMAL HIGH (ref 0–99)
Triglycerides: 178 mg/dL — ABNORMAL HIGH (ref 0–149)
VLDL Cholesterol Cal: 31 mg/dL (ref 5–40)

## 2020-11-02 LAB — TSH: TSH: 2.95 u[IU]/mL (ref 0.450–4.500)

## 2020-11-02 LAB — VITAMIN D 25 HYDROXY (VIT D DEFICIENCY, FRACTURES): Vit D, 25-Hydroxy: 42.2 ng/mL (ref 30.0–100.0)

## 2020-11-02 LAB — PHOSPHORUS: Phosphorus: 3.3 mg/dL (ref 3.0–4.3)

## 2020-11-02 LAB — PTH, INTACT AND CALCIUM: PTH: 38 pg/mL (ref 15–65)

## 2020-11-02 MED ORDER — PRAVASTATIN SODIUM 40 MG PO TABS: 40.0000 mg | ORAL_TABLET | Freq: Every day | ORAL | 4 refills | Status: DC

## 2020-11-02 NOTE — Progress Notes (Signed)
Good morning, please let Emma Sullivan know her labs have returned.  We need to fax all her blood work to Dr. Smith Mince at [facility] -- please do this and we will need to find fax number as is not on form they sent over.  Let Emma Sullivan know her labs are remaining at baseline kidney function.  LDL, bad cholesterol level, is still above goal.  I would recommend increasing Pravastatin to 40 MG daily, is she okay with this change.  Then we will recheck levels next visit.  Any questions? Keep being awesome!!  Thank you for allowing me to participate in your care.  I appreciate you. Kindest regards, Avaree Gilberti

## 2020-11-24 ENCOUNTER — Other Ambulatory Visit: Payer: Self-pay | Admitting: Nurse Practitioner

## 2020-11-24 ENCOUNTER — Telehealth: Payer: Self-pay

## 2020-11-24 DIAGNOSIS — Z111 Encounter for screening for respiratory tuberculosis: Secondary | ICD-10-CM

## 2020-11-24 NOTE — Telephone Encounter (Signed)
Copied from [place] [id number]. Topic: General - Other >> Nov 23, 2020  2:45 PM Wynetta Emery, [place] C wrote: Reason for CRM: pt called in to schedule to have her TB skin test completed. Pt says that she need this for work.   Please assist pt further.   [phone]   Called and explained to patient that we do not do the TB skin test here and that we do the blood draw for this. Patient stated that the blood draw would be fine. Patient scheduled for 09/29/20 for lab visit. Routing to provider to sign off on order.

## 2020-11-30 ENCOUNTER — Other Ambulatory Visit: Payer: Self-pay

## 2020-11-30 ENCOUNTER — Other Ambulatory Visit: Payer: 59

## 2020-11-30 ENCOUNTER — Telehealth: Payer: Self-pay | Admitting: Nurse Practitioner

## 2020-11-30 DIAGNOSIS — Z111 Encounter for screening for respiratory tuberculosis: Secondary | ICD-10-CM

## 2020-11-30 NOTE — Telephone Encounter (Signed)
Pt came in and dropped off a [place] Assessment/Medical Report to be completed by the provider.  Pt would like to be called [phone] to pick the form up once completed.  Placed in providers folder for completion.

## 2020-12-01 NOTE — Telephone Encounter (Signed)
Patient notified and verbalized understanding. Patient was informed that her TB blood test result may take up to 3 to 5 business days. Patient verbalized understanding.

## 2020-12-02 LAB — QUANTIFERON-TB GOLD PLUS
QuantiFERON Mitogen Value: 3.57 IU/mL
QuantiFERON Nil Value: 0.03 IU/mL
QuantiFERON TB1 Ag Value: 0.03 IU/mL
QuantiFERON TB2 Ag Value: 0.04 IU/mL
QuantiFERON-TB Gold Plus: NEGATIVE

## 2020-12-02 NOTE — Progress Notes (Signed)
Good morning, please let Emma Sullivan know her TB testing has returned negative -- she needs this for her form.  Have a good day.

## 2020-12-05 ENCOUNTER — Telehealth: Payer: Self-pay | Admitting: Nurse Practitioner

## 2020-12-05 NOTE — Telephone Encounter (Signed)
Patient's spouse, Harlo Ervine, picked up patient's signed Staff Assessment paperwork.  No further action needed.

## 2021-01-10 ENCOUNTER — Encounter: Payer: Self-pay | Admitting: Nurse Practitioner

## 2021-01-10 ENCOUNTER — Telehealth (INDEPENDENT_AMBULATORY_CARE_PROVIDER_SITE_OTHER): Payer: 59 | Admitting: Nurse Practitioner

## 2021-01-10 ENCOUNTER — Telehealth: Payer: 59 | Admitting: Nurse Practitioner

## 2021-01-10 VITALS — Ht 61.0 in | Wt 187.0 lb

## 2021-01-10 DIAGNOSIS — J040 Acute laryngitis: Secondary | ICD-10-CM | POA: Diagnosis not present

## 2021-01-10 MED ORDER — METHYLPREDNISOLONE 4 MG PO TBPK: ORAL_TABLET | ORAL | 0 refills | Status: DC

## 2021-01-10 MED ORDER — AMOXICILLIN-POT CLAVULANATE 875-125 MG PO TABS: 1.0000 | ORAL_TABLET | Freq: Two times a day (BID) | ORAL | 0 refills | Status: AC

## 2021-01-10 NOTE — Progress Notes (Signed)
Ht '5\' 1"'$  (1.549 m)   Wt 187 lb (84.8 kg)   LMP  (LMP Unknown)   BMI 35.33 kg/m    Subjective:    Patient ID: Emma Sullivan, female    DOB: 08-Nov-1960, 60 y.o.   MRN: [id number]  HPI: Normie Birks is a 60 y.o. female  Chief Complaint  Patient presents with   URI    UPPER RESPIRATORY TRACT INFECTION Worst symptom: laryngitis, not being able to hear, going on for 1 week  Fever: no Cough: no Shortness of breath: no Wheezing: no Chest pain: no Chest tightness: no Chest congestion: yes Nasal congestion: yes Runny nose: yes Post nasal drip: no Sneezing: no Sore throat: no Swollen glands: no Sinus pressure: no Headache: no Face pain: no Toothache: no Ear pain: yes bilateral Ear pressure: yes bilateral Eyes red/itching:no Eye drainage/crusting: no  Vomiting: no Rash: no Fatigue: no Sick contacts: no Strep contacts: no  Context:  unknown Recurrent sinusitis: no Relief with OTC cold/cough medications: no  Treatments attempted:  corcidine and pseudoephedrine   Relevant past medical, surgical, family and social history reviewed and updated as indicated. Interim medical history since our last visit reviewed. Allergies and medications reviewed and updated.  Review of Systems  Constitutional:  Negative for fatigue and fever.  HENT:  Positive for congestion and ear pain. Negative for dental problem, postnasal drip, rhinorrhea, sinus pressure, sinus pain, sneezing and sore throat.   Respiratory:  Negative for cough, shortness of breath and wheezing.   Cardiovascular:  Negative for chest pain.  Gastrointestinal:  Negative for vomiting.  Skin:  Negative for rash.  Neurological:  Negative for headaches.   Per HPI unless specifically indicated above     Objective:    Ht '5\' 1"'$  (1.549 m)   Wt 187 lb (84.8 kg)   LMP  (LMP Unknown)   BMI 35.33 kg/m   Wt Readings from Last 3 Encounters:  01/10/21 187 lb (84.8 kg)  11/01/20 187 lb (84.8 kg)  03/28/20 191 lb 6.4  oz (86.8 kg)    Physical Exam Vitals and nursing note reviewed.  HENT:     Mouth/Throat:     Comments: Patient has lost her voice. Pulmonary:     Effort: Pulmonary effort is normal. No respiratory distress.  Neurological:     Mental Status: She is alert.  Psychiatric:        Mood and Affect: Mood normal.        Behavior: Behavior normal.        Thought Content: Thought content normal.        Judgment: Judgment normal.    Results for orders placed or performed in visit on 11/30/20  QuantiFERON-TB Gold Plus  Result Value Ref Range   QuantiFERON Incubation Incubation performed.    QuantiFERON Criteria Comment    QuantiFERON TB1 Ag Value 0.03 IU/mL   QuantiFERON TB2 Ag Value 0.04 IU/mL   QuantiFERON Nil Value 0.03 IU/mL   QuantiFERON Mitogen Value 3.57 IU/mL   QuantiFERON-TB Gold Plus Negative Negative      Assessment & Plan:   Problem List Items Addressed This Visit   None Visit Diagnoses     Laryngitis    -  Primary   Complete course of antibiotics and steroids. Continue with OTC medications for symptom management. Can gargle salt water. FU if symptoms do not improve.         Follow up plan: Return if symptoms worsen or fail to improve.  This visit was  completed via MyChart due to the restrictions of the COVID-19 pandemic. All issues as above were discussed and addressed. Physical exam was done as above through visual confirmation on MyChart. If it was felt that the patient should be evaluated in the office, they were directed there. The patient verbally consented to this visit. Location of the patient: Work Location of the provider: Office Those involved with this call:  Provider: Jon Billings, NP CMA: Elizabeth Palau, CMA Front Desk/Registration: Myrlene Broker This encounter was conducted via phone.  I spent 15 dedicated to the care of this patient on the date of this encounter to include previsit review of 21, time with the patient, and post visit ordering of  testing.

## 2021-01-12 ENCOUNTER — Telehealth: Payer: 59 | Admitting: Nurse Practitioner

## 2021-02-26 ENCOUNTER — Other Ambulatory Visit: Payer: Self-pay | Admitting: Nurse Practitioner

## 2021-02-26 NOTE — Telephone Encounter (Signed)
Requested Prescriptions  Pending Prescriptions Disp Refills  . metoprolol tartrate (LOPRESSOR) 100 MG tablet [Pharmacy Med Name: METOPROLOL TARTRATE 100 MG TAB] 180 tablet 0    Sig: Take 1 tablet (100 mg total) by mouth 2 (two) times daily.     Cardiovascular:  Beta Blockers Passed - 02/26/2021 10:25 AM      Passed - Last BP in normal range    BP Readings from Last 1 Encounters:  11/01/20 133/73         Passed - Last Heart Rate in normal range    Pulse Readings from Last 1 Encounters:  11/01/20 62         Passed - Valid encounter within last 6 months    Recent Outpatient Visits          1 month ago [place], Santiago Glad, NP   3 months ago Hypertensive heart and kidney disease without HF and with CKD stage IV ([place])   [place] Cannady, Jolene T, NP   11 months ago Hypertensive heart and kidney disease without HF and with CKD stage IV ([place])   [place], Jolene T, NP   1 year ago Hypertensive heart and kidney disease without HF and with CKD stage IV ([place])   [place] Cannady, Jolene T, NP   1 year ago Hypertensive heart and kidney disease without HF and with CKD stage IV ([place])   [place] Cannady, Barbaraann Faster, NP      Future Appointments            In 2 months Cannady, Barbaraann Faster, NP [employer]

## 2021-04-29 NOTE — Patient Instructions (Incomplete)

## 2021-05-04 ENCOUNTER — Ambulatory Visit: Payer: Self-pay | Admitting: Nurse Practitioner

## 2021-05-04 DIAGNOSIS — I1310 Hypertensive heart and chronic kidney disease without heart failure, with stage 1 through stage 4 chronic kidney disease, or unspecified chronic kidney disease: Secondary | ICD-10-CM

## 2021-05-04 DIAGNOSIS — E7800 Pure hypercholesterolemia, unspecified: Secondary | ICD-10-CM

## 2021-05-04 DIAGNOSIS — N184 Chronic kidney disease, stage 4 (severe): Secondary | ICD-10-CM

## 2021-05-07 ENCOUNTER — Telehealth (INDEPENDENT_AMBULATORY_CARE_PROVIDER_SITE_OTHER): Payer: 59 | Admitting: Nurse Practitioner

## 2021-05-07 ENCOUNTER — Encounter: Payer: Self-pay | Admitting: Nurse Practitioner

## 2021-05-07 DIAGNOSIS — R051 Acute cough: Secondary | ICD-10-CM

## 2021-05-07 DIAGNOSIS — R059 Cough, unspecified: Secondary | ICD-10-CM | POA: Insufficient documentation

## 2021-05-07 MED ORDER — AZITHROMYCIN 250 MG PO TABS: ORAL_TABLET | ORAL | 0 refills | Status: AC

## 2021-05-07 MED ORDER — HYDROCOD POLI-CHLORPHE POLI ER 10-8 MG/5ML PO SUER: 5.0000 mL | Freq: Two times a day (BID) | ORAL | 0 refills | Status: DC | PRN

## 2021-05-07 MED ORDER — PREDNISONE 20 MG PO TABS: 40.0000 mg | ORAL_TABLET | Freq: Every day | ORAL | 0 refills | Status: AC

## 2021-05-07 NOTE — Progress Notes (Signed)
LMP  (LMP Unknown)    Subjective:    Patient ID: Emma Sullivan, female    DOB: 02-02-1961, 61 y.o.   MRN: [id number]  HPI: Emma Sullivan is a 61 y.o. female  Chief Complaint  Patient presents with   Cough    Patient states she started having issue with a cough about a week ago. Patient state she has tried Mucinex and Delsym. Patient states she is still having the cough. Patient having any fever or chills.   This visit was completed via telephone due to the restrictions of the COVID-19 pandemic. All issues as above were discussed and addressed but no physical exam was performed. If it was felt that the patient should be evaluated in the office, they were directed there. The patient verbally consented to this visit. Patient was unable to complete an audio/visual visit due to Technical difficulties, Lack of internet. Due to the catastrophic nature of the COVID-19 pandemic, this visit was done through audio contact only. Location of the patient: work Location of the provider: work Those involved with this call:  Provider: Marnee Guarneri, DNP CMA: Irena Reichmann, [place] Desk/Registration: [employer]  Time spent on call:  21 minutes on the phone discussing health concerns. 15 minutes total spent in review of patient's record and preparation of their chart.  I verified patient identity using two factors (patient name and date of birth). Patient consents verbally to being seen via telemedicine visit today.    UPPER RESPIRATORY TRACT INFECTION Reports ongoing for 2 weeks -- started out as a upper respiratory to start.  She did test negative for Covid.  Cough is lingering and non productive. Worst symptom: cough Fever: no Cough: yes Shortness of breath: no Wheezing: no Chest pain: no Chest tightness: no Chest congestion: no Nasal congestion: no Runny nose: no Post nasal drip: yes Sneezing: no Sore throat: no Swollen glands: no Sinus pressure: no Headache: no Face pain:  no Toothache: no Ear pain: none Ear pressure: none Eyes red/itching:no Eye drainage/crusting: no  Vomiting: no Rash: no Fatigue: yes Sick contacts: no Strep contacts: no  Context: stable Recurrent sinusitis: no Relief with OTC cold/cough medications: no  Treatments attempted: cold/sinus, mucinex, and cough syrup    Relevant past medical, surgical, family and social history reviewed and updated as indicated. Interim medical history since our last visit reviewed. Allergies and medications reviewed and updated.  Review of Systems  Constitutional:  Positive for fatigue. Negative for activity change, appetite change, chills and fever.  HENT:  Positive for postnasal drip. Negative for congestion, ear discharge, ear pain, rhinorrhea, sinus pressure, sinus pain, sneezing and sore throat.   Respiratory:  Positive for cough. Negative for chest tightness, shortness of breath and wheezing.   Cardiovascular:  Negative for chest pain, palpitations and leg swelling.  Neurological: Negative.   Psychiatric/Behavioral: Negative.     Per HPI unless specifically indicated above     Objective:    LMP  (LMP Unknown)   Wt Readings from Last 3 Encounters:  01/10/21 187 lb (84.8 kg)  11/01/20 187 lb (84.8 kg)  03/28/20 191 lb 6.4 oz (86.8 kg)    Physical Exam  Unable to perform due to telephone visit only.  Results for orders placed or performed in visit on 11/30/20  QuantiFERON-TB Gold Plus  Result Value Ref Range   QuantiFERON Incubation Incubation performed.    QuantiFERON Criteria Comment    QuantiFERON TB1 Ag Value 0.03 IU/mL   QuantiFERON TB2 Ag Value 0.04  IU/mL   QuantiFERON Nil Value 0.03 IU/mL   QuantiFERON Mitogen Value 3.57 IU/mL   QuantiFERON-TB Gold Plus Negative Negative      Assessment & Plan:   Problem List Items Addressed This Visit       Other   Cough    Ongoing for 2 weeks after URI - negative Covid at home.  At this time will treat with Zpack and Prednisone 40  MG daily x 5 days + Tussionex.  Recommend: - Increased rest - Increasing Fluids - Acetaminophen as needed for fever/pain.  - Salt water gargling, chloraseptic spray and throat lozenges - Mucinex.  - Humidifying the air. Return to office for ongoing or lingering symptoms.       I discussed the assessment and treatment plan with the patient. The patient was provided an opportunity to ask questions and all were answered. The patient agreed with the plan and demonstrated an understanding of the instructions.   The patient was advised to call back or seek an in-person evaluation if the symptoms worsen or if the condition fails to improve as anticipated.   I provided 21+ minutes of time during this encounter.   Follow up plan: Return if symptoms worsen or fail to improve.

## 2021-05-07 NOTE — Patient Instructions (Signed)

## 2021-05-07 NOTE — Assessment & Plan Note (Signed)
Ongoing for 2 weeks after URI - negative Covid at home.  At this time will treat with Zpack and Prednisone 40 MG daily x 5 days + Tussionex.  Recommend: - Increased rest - Increasing Fluids - Acetaminophen as needed for fever/pain.  - Salt water gargling, chloraseptic spray and throat lozenges - Mucinex.  - Humidifying the air. Return to office for ongoing or lingering symptoms.

## 2021-06-03 NOTE — Patient Instructions (Incomplete)
DASH Eating Plan °DASH stands for Dietary Approaches to Stop Hypertension. The DASH eating plan is a healthy eating plan that has been shown to: °Reduce high blood pressure (hypertension). °Reduce your risk for type 2 diabetes, heart disease, and stroke. °Help with weight loss. °What are tips for following this plan? °Reading food labels °Check food labels for the amount of salt (sodium) per serving. Choose foods with less than 5 percent of the Daily Value of sodium. Generally, foods with less than 300 milligrams (mg) of sodium per serving fit into this eating plan. °To find whole grains, look for the word "whole" as the first word in the ingredient list. °Shopping °Buy products labeled as "low-sodium" or "no salt added." °Buy fresh foods. Avoid canned foods and pre-made or frozen meals. °Cooking °Avoid adding salt when cooking. Use salt-free seasonings or herbs instead of table salt or sea salt. Check with your health care provider or pharmacist before using salt substitutes. °Do not fry foods. Cook foods using healthy methods such as baking, boiling, grilling, roasting, and broiling instead. °Cook with heart-healthy oils, such as olive, canola, avocado, soybean, or sunflower oil. °Meal planning ° °Eat a balanced diet that includes: °4 or more servings of fruits and 4 or more servings of vegetables each day. Try to fill one-half of your plate with fruits and vegetables. °6-8 servings of whole grains each day. °Less than 6 oz (170 g) of lean meat, poultry, or fish each day. A 3-oz (85-g) serving of meat is about the same size as a deck of cards. One egg equals 1 oz (28 g). °2-3 servings of low-fat dairy each day. One serving is 1 cup (237 mL). °1 serving of nuts, seeds, or beans 5 times each week. °2-3 servings of heart-healthy fats. Healthy fats called omega-3 fatty acids are found in foods such as walnuts, flaxseeds, fortified milks, and eggs. These fats are also found in cold-water fish, such as sardines, salmon,  and mackerel. °Limit how much you eat of: °Canned or prepackaged foods. °Food that is high in trans fat, such as some fried foods. °Food that is high in saturated fat, such as fatty meat. °Desserts and other sweets, sugary drinks, and other foods with added sugar. °Full-fat dairy products. °Do not salt foods before eating. °Do not eat more than 4 egg yolks a week. °Try to eat at least 2 vegetarian meals a week. °Eat more home-cooked food and less restaurant, buffet, and fast food. °Lifestyle °When eating at a restaurant, ask that your food be prepared with less salt or no salt, if possible. °If you drink alcohol: °Limit how much you use to: °0-1 drink a day for women who are not pregnant. °0-2 drinks a day for men. °Be aware of how much alcohol is in your drink. In the [place] one drink equals one 12 oz bottle of beer (355 mL), one 5 oz glass of wine (148 mL), or one 1½ oz glass of hard liquor (44 mL). °General information °Avoid eating more than 2,300 mg of salt a day. If you have hypertension, you may need to reduce your sodium intake to 1,500 mg a day. °Work with your health care provider to maintain a healthy body weight or to lose weight. Ask what an ideal weight is for you. °Get at least 30 minutes of exercise that causes your heart to beat faster (aerobic exercise) most days of the week. Activities may include walking, swimming, or biking. °Work with your health care provider or dietitian to   adjust your eating plan to your individual calorie needs. °What foods should I eat? °Fruits °All fresh, dried, or frozen fruit. Canned fruit in natural juice (without added sugar). °Vegetables °Fresh or frozen vegetables (raw, steamed, roasted, or grilled). Low-sodium or reduced-sodium tomato and vegetable juice. Low-sodium or reduced-sodium tomato sauce and tomato paste. Low-sodium or reduced-sodium canned vegetables. °Grains °Whole-grain or whole-wheat bread. Whole-grain or whole-wheat pasta. Brown rice. Oatmeal. Quinoa.  Bulgur. Whole-grain and low-sodium cereals. Pita bread. Low-fat, low-sodium crackers. Whole-wheat flour tortillas. °Meats and other proteins °Skinless chicken or turkey. Ground chicken or turkey. Pork with fat trimmed off. Fish and seafood. Egg whites. Dried beans, peas, or lentils. Unsalted nuts, nut butters, and seeds. Unsalted canned beans. Lean cuts of beef with fat trimmed off. Low-sodium, lean precooked or cured meat, such as sausages or meat loaves. °Dairy °Low-fat (1%) or fat-free (skim) milk. Reduced-fat, low-fat, or fat-free cheeses. Nonfat, low-sodium ricotta or cottage cheese. Low-fat or nonfat yogurt. Low-fat, low-sodium cheese. °Fats and oils °Soft margarine without trans fats. Vegetable oil. Reduced-fat, low-fat, or light mayonnaise and salad dressings (reduced-sodium). Canola, safflower, olive, avocado, soybean, and sunflower oils. Avocado. °Seasonings and condiments °Herbs. Spices. Seasoning mixes without salt. °Other foods °Unsalted popcorn and pretzels. Fat-free sweets. °The items listed above may not be a complete list of foods and beverages you can eat. Contact a dietitian for more information. °What foods should I avoid? °Fruits °Canned fruit in a light or heavy syrup. Fried fruit. Fruit in cream or butter sauce. °Vegetables °Creamed or fried vegetables. Vegetables in a cheese sauce. Regular canned vegetables (not low-sodium or reduced-sodium). Regular canned tomato sauce and paste (not low-sodium or reduced-sodium). Regular tomato and vegetable juice (not low-sodium or reduced-sodium). Pickles. Olives. °Grains °Baked goods made with fat, such as croissants, muffins, or some breads. Dry pasta or rice meal packs. °Meats and other proteins °Fatty cuts of meat. Ribs. Fried meat. Bacon. Bologna, salami, and other precooked or cured meats, such as sausages or meat loaves. Fat from the back of a pig (fatback). Bratwurst. Salted nuts and seeds. Canned beans with added salt. Canned or smoked fish.  Whole eggs or egg yolks. Chicken or turkey with skin. °Dairy °Whole or 2% milk, cream, and half-and-half. Whole or full-fat cream cheese. Whole-fat or sweetened yogurt. Full-fat cheese. Nondairy creamers. Whipped toppings. Processed cheese and cheese spreads. °Fats and oils °Butter. Stick margarine. Lard. Shortening. Ghee. Bacon fat. Tropical oils, such as coconut, palm kernel, or palm oil. °Seasonings and condiments °Onion salt, garlic salt, seasoned salt, table salt, and sea salt. Worcestershire sauce. Tartar sauce. Barbecue sauce. Teriyaki sauce. Soy sauce, including reduced-sodium. Steak sauce. Canned and packaged gravies. Fish sauce. Oyster sauce. Cocktail sauce. Store-bought horseradish. Ketchup. Mustard. Meat flavorings and tenderizers. Bouillon cubes. Hot sauces. Pre-made or packaged marinades. Pre-made or packaged taco seasonings. Relishes. Regular salad dressings. °Other foods °Salted popcorn and pretzels. °The items listed above may not be a complete list of foods and beverages you should avoid. Contact a dietitian for more information. °Where to find more information °National Heart, Lung, and Blood Institute: www.nhlbi.nih.gov °American Heart Association: www.heart.org °Academy of Nutrition and Dietetics: www.eatright.org °National Kidney Foundation: www.kidney.org °Summary °The DASH eating plan is a healthy eating plan that has been shown to reduce high blood pressure (hypertension). It may also reduce your risk for type 2 diabetes, heart disease, and stroke. °When on the DASH eating plan, aim to eat more fresh fruits and vegetables, whole grains, lean proteins, low-fat dairy, and heart-healthy fats. °With the DASH   eating plan, you should limit salt (sodium) intake to 2,300 mg a day. If you have hypertension, you may need to reduce your sodium intake to 1,500 mg a day. °Work with your health care provider or dietitian to adjust your eating plan to your individual calorie needs. °This information is not  intended to replace advice given to you by your health care provider. Make sure you discuss any questions you have with your health care provider. °Document Revised: 02/12/2019 Document Reviewed: 02/12/2019 °Elsevier Patient Education © 2022 [employer]vier Inc. ° °

## 2021-06-08 ENCOUNTER — Other Ambulatory Visit: Payer: Self-pay

## 2021-06-08 ENCOUNTER — Ambulatory Visit (INDEPENDENT_AMBULATORY_CARE_PROVIDER_SITE_OTHER): Payer: 59 | Admitting: Nurse Practitioner

## 2021-06-08 ENCOUNTER — Encounter: Payer: Self-pay | Admitting: Nurse Practitioner

## 2021-06-08 VITALS — BP 123/80 | HR 57 | Temp 98.0°F | Ht 61.0 in | Wt 174.0 lb

## 2021-06-08 DIAGNOSIS — Z6832 Body mass index (BMI) 32.0-32.9, adult: Secondary | ICD-10-CM | POA: Diagnosis not present

## 2021-06-08 DIAGNOSIS — E7800 Pure hypercholesterolemia, unspecified: Secondary | ICD-10-CM

## 2021-06-08 DIAGNOSIS — I1310 Hypertensive heart and chronic kidney disease without heart failure, with stage 1 through stage 4 chronic kidney disease, or unspecified chronic kidney disease: Secondary | ICD-10-CM | POA: Diagnosis not present

## 2021-06-08 DIAGNOSIS — F418 Other specified anxiety disorders: Secondary | ICD-10-CM

## 2021-06-08 DIAGNOSIS — R69 Illness, unspecified: Secondary | ICD-10-CM | POA: Diagnosis not present

## 2021-06-08 DIAGNOSIS — Z6835 Body mass index (BMI) 35.0-35.9, adult: Secondary | ICD-10-CM

## 2021-06-08 DIAGNOSIS — N184 Chronic kidney disease, stage 4 (severe): Secondary | ICD-10-CM | POA: Diagnosis not present

## 2021-06-08 DIAGNOSIS — E6609 Other obesity due to excess calories: Secondary | ICD-10-CM

## 2021-06-08 MED ORDER — NIFEDIPINE ER OSMOTIC RELEASE 90 MG PO TB24: 90.0000 mg | ORAL_TABLET | Freq: Every day | ORAL | 4 refills | Status: DC

## 2021-06-08 MED ORDER — SODIUM BICARBONATE 650 MG PO TABS: 650.0000 mg | ORAL_TABLET | Freq: Two times a day (BID) | ORAL | 4 refills | Status: DC

## 2021-06-08 MED ORDER — PRAVASTATIN SODIUM 40 MG PO TABS: 40.0000 mg | ORAL_TABLET | Freq: Every day | ORAL | 4 refills | Status: DC

## 2021-06-08 MED ORDER — METOPROLOL TARTRATE 100 MG PO TABS: 100.0000 mg | ORAL_TABLET | Freq: Two times a day (BID) | ORAL | 4 refills | Status: DC

## 2021-06-08 NOTE — Assessment & Plan Note (Signed)
Chronic, stable with BP at goal in office today.  Continue current medication regimen and collaboration with nephrology.  Recent [facility] note reviewed, recommend she return when able to see them.  Recommend she check BP at home at least 3 mornings a week and document for provider + focus on DASH diet.  Return in 6 months. ?

## 2021-06-08 NOTE — Progress Notes (Signed)
? ?BP 123/80   Pulse (!) 57   Temp 98 ?F (36.7 ?C) (Oral)   Ht 5\' 1"  (1.549 m)   Wt 174 lb (78.9 kg)   LMP  (LMP Unknown)   SpO2 97%   BMI 32.88 kg/m?   ? ?Subjective:  ? ? Patient ID: Emma Sullivan, female    DOB: Sep 03, 1960, 61 y.o.   MRN: [id number] ? ?HPI: ?Emma Sullivan is a 61 y.o. female ? ?Chief Complaint  ?Patient presents with  ? Hyperlipidemia  ? Hypertension  ? Chronic Kidney Disease  ? Mood  ? ?HYPERTENSION / HYPERLIPIDEMIA ?Continues on Pravastatin and Metoprolol & Procardia + Sodium Bicarb. ?Satisfied with current treatment? yes ?Duration of hypertension: chronic ?BP monitoring frequency: not checking ?BP range:  ?BP medication side effects: no ?Duration of hyperlipidemia: chronic ?Cholesterol medication side effects: no ?Cholesterol supplements: none ?Medication compliance: good compliance ?Aspirin: no ?Recent stressors: no ?Recurrent headaches: no ?Visual changes: no ?Palpitations: no ?Dyspnea: no ?Chest pain: no ?Lower extremity edema: no ?Dizzy/lightheaded: no  ? ?CHRONIC KIDNEY DISEASE ?Followed by [facility] nephrology and last seen 10/17/20, at this time new insurance does not cover them and she would prefer to return to them, but is waiting.  Continues on sodium bicarbonate. ?CKD status: stable ?Medications renally dose: yes ?Previous renal evaluation: yes ?Pneumovax:  Up to Date ?Influenza Vaccine:  Up to Date  ? ?Depression screen [facility] 2/9 06/08/2021 11/01/2020 03/28/2020  ?Decreased Interest 1 3 3   ?Down, Depressed, Hopeless 1 3 3   ?PHQ - 2 Score 2 6 6   ?Altered sleeping 1 1 0  ?Tired, decreased energy 1 1 2   ?Change in appetite 1 3 2   ?Feeling bad or failure about yourself  1 3 2   ?Trouble concentrating 1 1 2   ?Moving slowly or fidgety/restless 0 1 2  ?Suicidal thoughts 0 3 0  ?PHQ-9 Score 7 19 16   ?Difficult doing work/chores Not difficult at all Not difficult at all -  ? ?GAD 7 : Generalized Anxiety Score 06/08/2021 11/01/2020  ?Nervous, Anxious, on Edge 1 1  ?Control/stop worrying  1 3  ?Worry too much - different things 1 3  ?Trouble relaxing 1 1  ?Restless 1 1  ?Easily annoyed or irritable 1 3  ?Afraid - awful might happen 1 1  ?Total GAD 7 Score 7 13  ?Anxiety Difficulty Somewhat difficult Not difficult at all  ? ? ?Relevant past medical, surgical, family and social history reviewed and updated as indicated. Interim medical history since our last visit reviewed. ?Allergies and medications reviewed and updated. ? ?Review of Systems  ?Constitutional:  Negative for activity change, appetite change, diaphoresis, fatigue and fever.  ?Respiratory:  Negative for cough, chest tightness and shortness of breath.   ?Cardiovascular:  Negative for chest pain, palpitations and leg swelling.  ?Gastrointestinal:  Negative for abdominal distention, abdominal pain, constipation, diarrhea, nausea and vomiting.  ?Endocrine: Negative for cold intolerance and heat intolerance.  ?Neurological:  Negative for dizziness, syncope, weakness, light-headedness, numbness and headaches.  ?Psychiatric/Behavioral: Negative.    ? ?Per HPI unless specifically indicated above ? ?   ?Objective:  ?  ?BP 123/80   Pulse (!) 57   Temp 98 ?F (36.7 ?C) (Oral)   Ht 5\' 1"  (1.549 m)   Wt 174 lb (78.9 kg)   LMP  (LMP Unknown)   SpO2 97%   BMI 32.88 kg/m?   ?Wt Readings from Last 3 Encounters:  ?06/08/21 174 lb (78.9 kg)  ?01/10/21 187 lb (84.8 kg)  ?  11/01/20 187 lb (84.8 kg)  ?  ?Physical Exam ?Vitals and nursing note reviewed.  ?Constitutional:   ?   General: She is awake. She is not in acute distress. ?   Appearance: She is well-developed and well-groomed. She is obese. She is not ill-appearing or toxic-appearing.  ?HENT:  ?   Head: Normocephalic.  ?   Right Ear: Hearing normal.  ?   Left Ear: Hearing normal.  ?Eyes:  ?   General: Lids are normal.     ?   Right eye: No discharge.     ?   Left eye: No discharge.  ?   Conjunctiva/sclera: Conjunctivae normal.  ?   Pupils: Pupils are equal, round, and reactive to light.  ?Neck:  ?    Thyroid: No thyromegaly.  ?   Vascular: No carotid bruit.  ?Cardiovascular:  ?   Rate and Rhythm: Normal rate and regular rhythm.  ?   Heart sounds: Normal heart sounds. No murmur heard. ?  No gallop.  ?Pulmonary:  ?   Effort: Pulmonary effort is normal. No accessory muscle usage or respiratory distress.  ?   Breath sounds: Normal breath sounds.  ?Abdominal:  ?   General: Bowel sounds are normal.  ?   Palpations: Abdomen is soft. There is no hepatomegaly or splenomegaly.  ?Musculoskeletal:  ?   Cervical back: Normal range of motion and neck supple.  ?   Right lower leg: No edema.  ?   Left lower leg: No edema.  ?Skin: ?   General: Skin is warm and dry.  ?Neurological:  ?   Mental Status: She is alert and oriented to person, place, and time.  ?Psychiatric:     ?   Attention and Perception: Attention normal.     ?   Mood and Affect: Mood normal.     ?   Behavior: Behavior normal. Behavior is cooperative.     ?   Thought Content: Thought content normal.     ?   Judgment: Judgment normal.  ? ?Results for orders placed or performed in visit on 11/30/20  ?QuantiFERON-TB Gold Plus  ?Result Value Ref Range  ? QuantiFERON Incubation Incubation performed.   ? QuantiFERON Criteria Comment   ? QuantiFERON TB1 Ag Value 0.03 IU/mL  ? QuantiFERON TB2 Ag Value 0.04 IU/mL  ? QuantiFERON Nil Value 0.03 IU/mL  ? QuantiFERON Mitogen Value 3.57 IU/mL  ? QuantiFERON-TB Gold Plus Negative Negative  ? ?   ?Assessment & Plan:  ? ?Problem List Items Addressed This Visit   ? ?  ? Cardiovascular and Mediastinum  ? Hypertensive heart and kidney disease without HF and with CKD stage IV ([place]) - Primary  ?  Chronic, stable with BP at goal in office today.  Continue current medication regimen and collaboration with nephrology.  Recent [facility] note reviewed, recommend she return when able to see them.  Recommend she check BP at home at least 3 mornings a week and document for provider + focus on DASH diet.  Return in 6 months. ?  ?  ? Relevant  Medications  ? metoprolol tartrate (LOPRESSOR) 100 MG tablet  ? NIFEdipine (PROCARDIA XL/NIFEDICAL-XL) 90 MG 24 hr tablet  ? pravastatin (PRAVACHOL) 40 MG tablet  ? Other Relevant Orders  ? CBC with Differential/Platelet  ? TSH  ? Urine Microalbumin w/creat. ratio  ?  ? Genitourinary  ? Chronic kidney disease, stage 4 (severe) (HCC)  ?  Followed by [facility] nephrology with recent visit reviewed,  recommend she return to visit when able, currently her insurance does not cover them.  Will obtain all labs today and refills sent. ?  ?  ? Relevant Orders  ? CBC with Differential/Platelet  ? PTH, intact and calcium  ? Urine Microalbumin w/creat. ratio  ? Phosphorus  ? VITAMIN D 25 Hydroxy (Vit-D Deficiency, Fractures)  ?  ? Other  ? Depression with anxiety  ?  Ongoing, stable.  Denies SI/HI.  No current medications and does not wish to start, refuses.  Continue to monitor. ?  ?  ? Relevant Orders  ? TSH  ? Hyperlipemia  ?  Chronic, stable.  Continue current medication regimen and adjust as needed.  Obtain lipid panel today. ?  ?  ? Relevant Medications  ? metoprolol tartrate (LOPRESSOR) 100 MG tablet  ? NIFEdipine (PROCARDIA XL/NIFEDICAL-XL) 90 MG 24 hr tablet  ? pravastatin (PRAVACHOL) 40 MG tablet  ? Other Relevant Orders  ? Lipid Panel w/o Chol/HDL Ratio  ? Obesity  ?  BMI 32.88.  Recommended eating smaller high protein, low fat meals more frequently and exercising 30 mins a day 5 times a week with a goal of 10-15lb weight loss in the next 3 months. Patient voiced their understanding and motivation to adhere to these recommendations. ? ?  ?  ? Relevant Medications  ? sodium bicarbonate 650 MG tablet  ?  ? ?Follow up plan: ?Return in about 6 months (around 12/09/2021) for HTN/HLD, CKD. ?

## 2021-06-08 NOTE — Assessment & Plan Note (Signed)
Chronic, stable.  Continue current medication regimen and adjust as needed.  Obtain lipid panel today. ?

## 2021-06-08 NOTE — Assessment & Plan Note (Signed)
Followed by [facility] nephrology with recent visit reviewed, recommend she return to visit when able, currently her insurance does not cover them.  Will obtain all labs today and refills sent. ?

## 2021-06-08 NOTE — Assessment & Plan Note (Signed)
BMI 32.88.  Recommended eating smaller high protein, low fat meals more frequently and exercising 30 mins a day 5 times a week with a goal of 10-15lb weight loss in the next 3 months. Patient voiced their understanding and motivation to adhere to these recommendations.  

## 2021-06-08 NOTE — Assessment & Plan Note (Signed)
Ongoing, stable.  Denies SI/HI.  No current medications and does not wish to start, refuses.  Continue to monitor. ?

## 2021-06-09 LAB — CBC WITH DIFFERENTIAL/PLATELET
Basophils Absolute: 0.1 10*3/uL (ref 0.0–0.2)
Basos: 1 %
EOS (ABSOLUTE): 0.3 10*3/uL (ref 0.0–0.4)
Eos: 4 %
Hematocrit: 43.4 % (ref 34.0–46.6)
Hemoglobin: 14.4 g/dL (ref 11.1–15.9)
Immature Grans (Abs): 0 10*3/uL (ref 0.0–0.1)
Immature Granulocytes: 0 %
Lymphocytes Absolute: 1.6 10*3/uL (ref 0.7–3.1)
Lymphs: 22 %
MCH: 28.2 pg (ref 26.6–33.0)
MCHC: 33.2 g/dL (ref 31.5–35.7)
MCV: 85 fL (ref 79–97)
Monocytes Absolute: 0.3 10*3/uL (ref 0.1–0.9)
Monocytes: 4 %
Neutrophils Absolute: 4.9 10*3/uL (ref 1.4–7.0)
Neutrophils: 69 %
Platelets: 213 10*3/uL (ref 150–450)
RBC: 5.1 x10E6/uL (ref 3.77–5.28)
RDW: 14.9 % (ref 11.7–15.4)
WBC: 7.1 10*3/uL (ref 3.4–10.8)

## 2021-06-09 LAB — MICROALBUMIN / CREATININE URINE RATIO
Creatinine, Urine: 119.5 mg/dL
Microalb/Creat Ratio: 495 mg/g creat — ABNORMAL HIGH (ref 0–29)
Microalbumin, Urine: 592 ug/mL

## 2021-06-09 LAB — LIPID PANEL W/O CHOL/HDL RATIO
Cholesterol, Total: 144 mg/dL (ref 100–199)
HDL: 40 mg/dL (ref >39)
LDL Chol Calc (NIH): 90 mg/dL (ref 0–99)
Triglycerides: 70 mg/dL (ref 0–149)
VLDL Cholesterol Cal: 14 mg/dL (ref 5–40)

## 2021-06-09 LAB — PTH, INTACT AND CALCIUM
Calcium: 10 mg/dL (ref 8.7–10.3)
PTH: 53 pg/mL (ref 15–65)

## 2021-06-09 LAB — PHOSPHORUS: Phosphorus: 3.8 mg/dL (ref 3.0–4.3)

## 2021-06-09 LAB — VITAMIN D 25 HYDROXY (VIT D DEFICIENCY, FRACTURES): Vit D, 25-Hydroxy: 41.5 ng/mL (ref 30.0–100.0)

## 2021-06-09 LAB — TSH: TSH: 2.35 u[IU]/mL (ref 0.450–4.500)

## 2021-06-09 NOTE — Addendum Note (Signed)
Addended by: Marnee Guarneri T on: 06/09/2021 09:18 AM ? ? Modules accepted: Orders ? ?

## 2021-06-09 NOTE — Progress Notes (Signed)
Good morning, please let Adaliz know labs have returned and overall are stable, no medication changes needed.  They did not obtain kidney labs as ordered, I will check on this and have run off labs we have and notify her once resulted.  Any questions? ?Keep being amazing!!  Thank you for allowing me to participate in your care.  I appreciate you. ?Kindest regards, ?Taran Hable ?

## 2021-06-11 ENCOUNTER — Telehealth: Payer: Self-pay

## 2021-06-11 NOTE — Telephone Encounter (Signed)
Noted  

## 2021-06-11 NOTE — Telephone Encounter (Signed)
-----   Message from Venita Lick, NP sent at 06/11/2021  6:01 AM EDT ----- ?Regarding: FW: add on CMP ?Can you see if Wilhemena Durie can add on a CMP to this patient for me from blood work Friday, I somehow forgot to add it in the labs and need it.  I will sign paper tomorrow. ?----- Message ----- ?From: Venita Lick, NP ?Sent: 06/11/2021  12:00 AM EDT ?To: Background Patient List Reminders ?Subject: add on CMP                                    ? ? ? ?

## 2021-06-11 NOTE — Telephone Encounter (Signed)
Per Henrine Screws, requested a lab for patient. Previous message was routed to the lab tech [place]. Ana verbalized understanding and says she would look into provider's request.  ?

## 2021-06-12 LAB — COMPREHENSIVE METABOLIC PANEL
ALT: 15 IU/L (ref 0–32)
AST: 20 IU/L (ref 0–40)
Albumin/Globulin Ratio: 2 (ref 1.2–2.2)
Albumin: 4.5 g/dL (ref 3.8–4.8)
Alkaline Phosphatase: 137 IU/L — ABNORMAL HIGH (ref 44–121)
BUN/Creatinine Ratio: 18 (ref 12–28)
BUN: 42 mg/dL — ABNORMAL HIGH (ref 8–27)
Bilirubin Total: 0.3 mg/dL (ref 0.0–1.2)
CO2: 18 mmol/L — ABNORMAL LOW (ref 20–29)
Calcium: 9.8 mg/dL (ref 8.7–10.3)
Chloride: 106 mmol/L (ref 96–106)
Creatinine, Ser: 2.37 mg/dL — ABNORMAL HIGH (ref 0.57–1.00)
Globulin, Total: 2.2 g/dL (ref 1.5–4.5)
Glucose: 103 mg/dL — ABNORMAL HIGH (ref 70–99)
Potassium: 4.9 mmol/L (ref 3.5–5.2)
Sodium: 142 mmol/L (ref 134–144)
Total Protein: 6.7 g/dL (ref 6.0–8.5)
eGFR: 22 mL/min/{1.73_m2} — ABNORMAL LOW (ref >59)

## 2021-06-12 LAB — SPECIMEN STATUS REPORT

## 2021-06-12 NOTE — Progress Notes (Signed)
Please let Emma Sullivan know her kidney function labs have returned and these remain at baseline for her.  I do however recommend she schedule at [facility] as soon as possible to continue this collaboration with them. Any questions? ?Keep being awesome!!  Thank you for allowing me to participate in your care.  I appreciate you. ?Kindest regards, ?Paublo Warshawsky ?

## 2021-08-10 ENCOUNTER — Telehealth (INDEPENDENT_AMBULATORY_CARE_PROVIDER_SITE_OTHER): Payer: 59 | Admitting: Physician Assistant

## 2021-08-10 ENCOUNTER — Encounter: Payer: Self-pay | Admitting: Physician Assistant

## 2021-08-10 DIAGNOSIS — H109 Unspecified conjunctivitis: Secondary | ICD-10-CM | POA: Diagnosis not present

## 2021-08-10 MED ORDER — OFLOXACIN 0.3 % OP SOLN: 1.0000 [drp] | Freq: Four times a day (QID) | OPHTHALMIC | 0 refills | Status: DC

## 2021-08-10 NOTE — Patient Instructions (Addendum)
Based on your symptoms I believe that you have bacterial conjunctivitis  I have sent in a script for Ocuflox ophthalmic solution for you to use in the right eye. Instill 1 drop every 1-2 hours for the first 2 days then one drop in the right eye every 6 hours for 5 days Contact your eye doctor about the best way to sterilize your hard contacts without damaging them and the best way to use them during your treatment - it is not recommended that you wear them at all while being treated for bacterial conjunctivitis since they can trap bacteria and debris and cause further issues.   You can use sterile eye flushes and lubricating eye drops to assist with eye irritation and further resolution You can also use a warm compress over the eye to assist with swelling and matting - especially in the morning  If you have used makeup or mascara on that eye I recommend discarding it as this can cause recurrent infection. Thoroughly wash any makeup brushes and avoid using makeup while recovering from the infection.  If you notice the following please return to the office: lack of improvement, eyelid swelling, increased eye irritation If you notice the following please go to the ED: eye pressure causing displacement of the eye, vision changes, increased eye pain or foreign body sensation, fever

## 2021-08-10 NOTE — Progress Notes (Signed)
    Virtual Visit via Video Note  I connected with Emma Sullivan on 08/10/21 at  9:20 AM EDT by a video enabled telemedicine application and verified that I am speaking with the correct person using two identifiers.  Today's Provider: Talitha Givens, MHS, PA-C Introduced myself to the patient as a PA-C and provided education on APPs in clinical practice.   Location: Patient: at home in [place], [place]  Provider: [place], [place]    I discussed the limitations of evaluation and management by telemedicine and the availability of in person appointments. The patient expressed understanding and agreed to proceed.  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 Recommend she refrain from using contact lenses during treatment and consult with ophthalmology on most appropriate way to replace or sterilize lenses after treatment is concluded. Will provide script for Ofloxacin ophthalmic solution for 7 days Provided instructions for sterile eye flushes and using lubricating eye drops to assist with discomfort  Provided instructions for return precautions.  Recommend she follow up with eye doctor for persistent or progressing symptoms.    Relevant Medications   ofloxacin (OCUFLOX)  0.3 % ophthalmic solution       Follow Up Instructions:    I discussed the assessment and treatment plan with the patient. The patient was provided an opportunity to ask questions and all were answered. The patient agreed with the plan and demonstrated an understanding of the instructions.   The patient was advised to call back or seek an in-person evaluation if the symptoms worsen or if the condition fails to improve as anticipated.  I provided 15 minutes of non-face-to-face time during this encounter.   Kenia Teagarden E Katlynn Naser, PA-C  No follow-ups on file.   I, Joyleen Haselton E Delrae Hagey, PA-C, have reviewed all documentation for this visit. The documentation on 08/10/21 for the exam, diagnosis, procedures, and orders are all accurate and complete.   Talitha Givens, MHS, PA-C [facility]

## 2021-12-08 NOTE — Patient Instructions (Signed)
Chronic Kidney Disease, Adult Chronic kidney disease is when lasting damage happens to the kidneys slowly over a long time. The kidneys help to: Make pee (urine). Make hormones. Keep the right amount of fluids and chemicals in the body. Most often, this disease does not go away. You must take steps to help keep the kidney damage from getting worse. If steps are not taken, the kidneys might stop working forever. What are the causes? Diabetes. High blood pressure. Diseases that affect the heart and blood vessels. Other kidney diseases. Diseases of the body's disease-fighting system. A problem with the flow of pee. Infections of the organs that make pee, store it, and take it out of the body. Swelling or irritation of your blood vessels. What increases the risk? Getting older. Having someone in your family who has kidney disease or kidney failure. Having a disease caused by genes. Taking medicines often that harm the kidneys. Being near or having contact with harmful substances. Being very overweight. Using tobacco now or in the past. What are the signs or symptoms? Feeling very tired. Having a swollen face, legs, ankles, or feet. Feeling like you may vomit or vomiting. Not feeling hungry. Being confused or not able to focus. Twitches and cramps in the leg muscles or other muscles. Dry, itchy skin. A taste of metal in your mouth. Making less pee, or making more pee. Shortness of breath. Trouble sleeping. You may also become anemic or get weak bones. Anemic means there is not enough red blood cells or hemoglobin in your blood. You may get symptoms slowly. You may not notice them until the kidney damage gets very bad. How is this treated? Often, there is no cure for this disease. Treatment can help with symptoms and help keep the disease from getting worse. You may need to: Avoid alcohol. Avoid foods that are high in salt, potassium, phosphorous, and protein. Take medicines for  symptoms and to help control other conditions. Have dialysis. This treatment gets harmful waste out of your body. Treat other problems that cause your kidney disease or make it worse. Follow these instructions at home: Medicines Take over-the-counter and prescription medicines only as told by your doctor. Do not take any new medicines, vitamins, or supplements unless your doctor says it is okay. Lifestyle  Do not smoke or use any products that contain nicotine or tobacco. If you need help quitting, ask your doctor. If you drink alcohol: Limit how much you use to: 0-1 drink a day for women who are not pregnant. 0-2 drinks a day for men. Know how much alcohol is in your drink. In the [place] one drink equals one 12 oz bottle of beer (355 mL), one 5 oz glass of wine (148 mL), or one 1 oz glass of hard liquor (44 mL). Stay at a healthy weight. If you need help losing weight, ask your doctor. General instructions  Follow instructions from your doctor about what you cannot eat or drink. Track your blood pressure at home. Tell your doctor about any changes. If you have diabetes, track your blood sugar. Exercise at least 30 minutes a day, 5 days a week. Keep your shots (vaccinations) up to date. Keep all follow-up visits. Where to find more information American Association of Kidney Patients: www.aakp.org National Kidney Foundation: www.kidney.org American Kidney Fund: www.akfinc.org Life Options: www.lifeoptions.org Kidney School: www.kidneyschool.org Contact a doctor if: Your symptoms get worse. You get new symptoms. Get help right away if: You get symptoms of end-stage kidney disease. These   include: Headaches. Losing feeling in your hands or feet. Easy bruising. Having hiccups often. Chest pain. Shortness of breath. Lack of menstrual periods, in women. You have a fever. You make less pee than normal. You have pain or you bleed when you pee or poop. These symptoms may be an  emergency. Get help right away. Call your local emergency services (911 in the [place] Do not wait to see if the symptoms will go away. Do not drive yourself to the hospital. Summary Chronic kidney disease is when lasting damage happens to the kidneys slowly over a long time. Causes of this disease include diabetes and high blood pressure. Often, there is no cure for this disease. Treatment can help symptoms and help keep the disease from getting worse. Treatment may involve lifestyle changes, medicines, and dialysis. This information is not intended to replace advice given to you by your health care provider. Make sure you discuss any questions you have with your health care provider. Document Revised: 06/16/2019 Document Reviewed: 06/16/2019 Elsevier Patient Education  2023 Elsevier Inc.  

## 2021-12-11 ENCOUNTER — Ambulatory Visit: Payer: 59 | Admitting: Nurse Practitioner

## 2021-12-11 ENCOUNTER — Encounter: Payer: Self-pay | Admitting: Nurse Practitioner

## 2021-12-11 VITALS — BP 132/74 | HR 51 | Temp 97.7°F | Ht 61.0 in | Wt 167.8 lb

## 2021-12-11 DIAGNOSIS — N184 Chronic kidney disease, stage 4 (severe): Secondary | ICD-10-CM

## 2021-12-11 DIAGNOSIS — Z6832 Body mass index (BMI) 32.0-32.9, adult: Secondary | ICD-10-CM | POA: Diagnosis not present

## 2021-12-11 DIAGNOSIS — I1310 Hypertensive heart and chronic kidney disease without heart failure, with stage 1 through stage 4 chronic kidney disease, or unspecified chronic kidney disease: Secondary | ICD-10-CM

## 2021-12-11 DIAGNOSIS — E7800 Pure hypercholesterolemia, unspecified: Secondary | ICD-10-CM

## 2021-12-11 DIAGNOSIS — F418 Other specified anxiety disorders: Secondary | ICD-10-CM

## 2021-12-11 DIAGNOSIS — E6609 Other obesity due to excess calories: Secondary | ICD-10-CM | POA: Diagnosis not present

## 2021-12-11 DIAGNOSIS — R69 Illness, unspecified: Secondary | ICD-10-CM | POA: Diagnosis not present

## 2021-12-11 NOTE — Assessment & Plan Note (Signed)
Followed by [facility] nephrology in past at [facility] -- no recent visit due to insurance, recommend she return to visit or change to local nephrology group -- refuses referral at this time.  Will obtain all labs today: CMP and PTH.  If worsening labs will get her in sooner to nephrology group of her preference.

## 2021-12-11 NOTE — Assessment & Plan Note (Signed)
Chronic, stable.  BP at goal in office today.  Continue current medication regimen and adjust as needed.  Has not been back to see [facility] nephrology due to insurance, recommend she return for visit.  Recommend she check BP at home at least 3 mornings a week and document for provider + focus on DASH diet.  LABS: CMP.  Return in 6 months.

## 2021-12-11 NOTE — Assessment & Plan Note (Addendum)
BMI 31.71, has lost 20 pounds since October 2022.  Recommended eating smaller high protein, low fat meals more frequently and exercising 30 mins a day 5 times a week with a goal of 10-15lb weight loss in the next 3 months. Patient voiced their understanding and motivation to adhere to these recommendations.

## 2021-12-11 NOTE — Progress Notes (Signed)
BP 132/74   Pulse (!) 51   Temp 97.7 F (36.5 C) (Oral)   Ht 5\' 1"  (1.549 m)   Wt 167 lb 12.8 oz (76.1 kg)   LMP  (LMP Unknown)   SpO2 98%   BMI 31.71 kg/m    Subjective:    Patient ID: Emma Sullivan, female    DOB: 1961-02-28, 62 y.o.   MRN: [id number]  HPI: Emma Sullivan is a 61 y.o. female  Chief Complaint  Patient presents with   Hyperlipidemia   Hypertension   Chronic Kidney Disease   HYPERTENSION / HYPERLIPIDEMIA Taking Pravastatin & Metoprolol & Procardia + Sodium Bicarb. Satisfied with current treatment? yes Duration of hypertension: chronic BP monitoring frequency: not checking BP range:  BP medication side effects: no Duration of hyperlipidemia: chronic Cholesterol medication side effects: no Cholesterol supplements: none Medication compliance: good compliance Aspirin: no Recent stressors: no Recurrent headaches: no Visual changes: no Palpitations: no Dyspnea: no Chest pain: no Lower extremity edema: no Dizzy/lightheaded: no  The 10-year ASCVD risk score (Arnett DK, et al., 2019) is: 6.2%   Values used to calculate the score:     Age: 70 years     Sex: Female     Is Non-Hispanic African American: No     Diabetic: No     Tobacco smoker: No     Systolic Blood Pressure: 740 mmHg     Is BP treated: Yes     HDL Cholesterol: 40 mg/dL     Total Cholesterol: 144 mg/dL  CHRONIC KIDNEY DISEASE Followed by [facility] nephrology and last seen 10/17/20, at this time new insurance does not cover them. She would prefer to return to them, but is waiting.  Continues on sodium bicarbonate. CKD status: stable Medications renally dose: yes Previous renal evaluation: yes Pneumovax: Refuses Influenza Vaccine:  Refuses     12/11/2021    8:12 AM 06/08/2021    9:07 AM 11/01/2020    3:02 PM 03/28/2020    3:44 PM  Depression screen PHQ 2/9  Decreased Interest 1 1 3 3   Down, Depressed, Hopeless 1 1 3 3   PHQ - 2 Score 2 2 6 6   Altered sleeping 1 1 1  0   Tired, decreased energy 1 1 1 2   Change in appetite 0 1 3 2   Feeling bad or failure about yourself  0 1 3 2   Trouble concentrating 0 1 1 2   Moving slowly or fidgety/restless 0 0 1 2  Suicidal thoughts 0 0 3 0  PHQ-9 Score 4 7 19 16   Difficult doing work/chores  Not difficult at all Not difficult at all    Relevant past medical, surgical, family and social history reviewed and updated as indicated. Interim medical history since our last visit reviewed. Allergies and medications reviewed and updated.  Review of Systems  Constitutional:  Negative for activity change, appetite change, diaphoresis, fatigue and fever.  Respiratory:  Negative for cough, chest tightness and shortness of breath.   Cardiovascular:  Negative for chest pain, palpitations and leg swelling.  Gastrointestinal:  Negative for abdominal distention, abdominal pain, constipation, diarrhea, nausea and vomiting.  Endocrine: Negative for cold intolerance and heat intolerance.  Neurological:  Negative for dizziness, syncope, weakness, light-headedness, numbness and headaches.  Psychiatric/Behavioral: Negative.      Per HPI unless specifically indicated above     Objective:    BP 132/74   Pulse (!) 51   Temp 97.7 F (36.5 C) (Oral)   Ht 5\' 1"  (  1.549 m)   Wt 167 lb 12.8 oz (76.1 kg)   LMP  (LMP Unknown)   SpO2 98%   BMI 31.71 kg/m   Wt Readings from Last 3 Encounters:  12/11/21 167 lb 12.8 oz (76.1 kg)  06/08/21 174 lb (78.9 kg)  01/10/21 187 lb (84.8 kg)    Physical Exam Vitals and nursing note reviewed.  Constitutional:      General: She is awake. She is not in acute distress.    Appearance: She is well-developed and well-groomed. She is obese. She is not ill-appearing or toxic-appearing.  HENT:     Head: Normocephalic.     Right Ear: Hearing normal.     Left Ear: Hearing normal.  Eyes:     General: Lids are normal.        Right eye: No discharge.        Left eye: No discharge.      Conjunctiva/sclera: Conjunctivae normal.     Pupils: Pupils are equal, round, and reactive to light.  Neck:     Thyroid: No thyromegaly.     Vascular: No carotid bruit.  Cardiovascular:     Rate and Rhythm: Normal rate and regular rhythm.     Heart sounds: Normal heart sounds. No murmur heard.    No gallop.  Pulmonary:     Effort: Pulmonary effort is normal. No accessory muscle usage or respiratory distress.     Breath sounds: Normal breath sounds.  Abdominal:     General: Bowel sounds are normal.     Palpations: Abdomen is soft. There is no hepatomegaly or splenomegaly.  Musculoskeletal:     Cervical back: Normal range of motion and neck supple.     Right lower leg: No edema.     Left lower leg: No edema.  Skin:    General: Skin is warm and dry.  Neurological:     Mental Status: She is alert and oriented to person, place, and time.  Psychiatric:        Attention and Perception: Attention normal.        Mood and Affect: Mood normal.        Behavior: Behavior normal. Behavior is cooperative.        Thought Content: Thought content normal.        Judgment: Judgment normal.    Results for orders placed or performed in visit on 06/08/21  CBC with Differential/Platelet  Result Value Ref Range   WBC 7.1 3.4 - 10.8 x10E3/uL   RBC 5.10 3.77 - 5.28 x10E6/uL   Hemoglobin 14.4 11.1 - 15.9 g/dL   Hematocrit 43.4 34.0 - 46.6 %   MCV 85 79 - 97 fL   MCH 28.2 26.6 - 33.0 pg   MCHC 33.2 31.5 - 35.7 g/dL   RDW 14.9 11.7 - 15.4 %   Platelets 213 150 - 450 x10E3/uL   Neutrophils 69 Not Estab. %   Lymphs 22 Not Estab. %   Monocytes 4 Not Estab. %   Eos 4 Not Estab. %   Basos 1 Not Estab. %   Neutrophils Absolute 4.9 1.4 - 7.0 x10E3/uL   Lymphocytes Absolute 1.6 0.7 - 3.1 x10E3/uL   Monocytes Absolute 0.3 0.1 - 0.9 x10E3/uL   EOS (ABSOLUTE) 0.3 0.0 - 0.4 x10E3/uL   Basophils Absolute 0.1 0.0 - 0.2 x10E3/uL   Immature Granulocytes 0 Not Estab. %   Immature Grans (Abs) 0.0 0.0 - 0.1  x10E3/uL  Lipid Panel w/o Chol/HDL Ratio  Result  Value Ref Range   Cholesterol, Total 144 100 - 199 mg/dL   Triglycerides 70 0 - 149 mg/dL   HDL 40 >39 mg/dL   VLDL Cholesterol Cal 14 5 - 40 mg/dL   LDL Chol Calc (NIH) 90 0 - 99 mg/dL  TSH  Result Value Ref Range   TSH 2.350 0.450 - 4.500 uIU/mL  PTH, intact and calcium  Result Value Ref Range   Calcium 10.0 8.7 - 10.3 mg/dL   PTH 53 15 - 65 pg/mL   PTH Interp Comment   Urine Microalbumin w/creat. ratio  Result Value Ref Range   Creatinine, Urine 119.5 Not Estab. mg/dL   Microalbumin, Urine 592.0 Not Estab. ug/mL   Microalb/Creat Ratio 495 (H) 0 - 29 mg/g creat  Phosphorus  Result Value Ref Range   Phosphorus 3.8 3.0 - 4.3 mg/dL  VITAMIN D 25 Hydroxy (Vit-D Deficiency, Fractures)  Result Value Ref Range   Vit D, 25-Hydroxy 41.5 30.0 - 100.0 ng/mL  Comprehensive metabolic panel  Result Value Ref Range   Glucose 103 (H) 70 - 99 mg/dL   BUN 42 (H) 8 - 27 mg/dL   Creatinine, Ser 2.37 (H) 0.57 - 1.00 mg/dL   eGFR 22 (L) >59 mL/min/1.73   BUN/Creatinine Ratio 18 12 - 28   Sodium 142 134 - 144 mmol/L   Potassium 4.9 3.5 - 5.2 mmol/L   Chloride 106 96 - 106 mmol/L   CO2 18 (L) 20 - 29 mmol/L   Calcium 9.8 8.7 - 10.3 mg/dL   Total Protein 6.7 6.0 - 8.5 g/dL   Albumin 4.5 3.8 - 4.8 g/dL   Globulin, Total 2.2 1.5 - 4.5 g/dL   Albumin/Globulin Ratio 2.0 1.2 - 2.2   Bilirubin Total 0.3 0.0 - 1.2 mg/dL   Alkaline Phosphatase 137 (H) 44 - 121 IU/L   AST 20 0 - 40 IU/L   ALT 15 0 - 32 IU/L  Specimen status report  Result Value Ref Range   specimen status report Comment       Assessment & Plan:   Problem List Items Addressed This Visit       Cardiovascular and Mediastinum   Hypertensive heart and kidney disease without HF and with CKD stage IV (HCC)    Chronic, stable.  BP at goal in office today.  Continue current medication regimen and adjust as needed.  Has not been back to see [facility] nephrology due to insurance, recommend  she return for visit.  Recommend she check BP at home at least 3 mornings a week and document for provider + focus on DASH diet.  LABS: CMP.  Return in 6 months.      Relevant Orders   CBC with Differential/Platelet   Comprehensive metabolic panel     Genitourinary   Chronic kidney disease, stage 4 (severe) (HCC) - Primary    [facility] nephrology in past at [facility] -- no recent visit due to insurance, recommend she return to visit or change to local nephrology group -- refuses referral at this time.  Will obtain all labs today: CMP and PTH.  If worsening labs will get her in sooner to nephrology group of her preference.      Relevant Orders   CBC with Differential/Platelet   Comprehensive metabolic panel   PTH, intact and calcium     Other   RESOLVED: Depression with anxiety   Hyperlipemia    Chronic, stable.  Continue current medication regimen and adjust as needed.  Obtain lipid panel today.      Relevant Orders   Comprehensive metabolic panel   Lipid Panel w/o Chol/HDL Ratio   Obesity    BMI 31.71, has lost 20 pounds since October 2022.  Recommended eating smaller high protein, low fat meals more frequently and exercising 30 mins a day 5 times a week with a goal of 10-15lb weight loss in the next 3 months. Patient voiced their understanding and motivation to adhere to these recommendations.         Follow up plan: Return in about 6 months (around 06/11/2022) for HTN/HLD, CKD.

## 2021-12-11 NOTE — Assessment & Plan Note (Signed)
Chronic, stable.  Continue current medication regimen and adjust as needed.  Obtain lipid panel today.

## 2021-12-12 NOTE — Progress Notes (Signed)
Good day, please let Emma Sullivan know her labs have returned and overall remain at baseline for her, including kidney function.  No medication changes needed.  I do recommend she try to schedule follow-up with nephrology though due to her ongoing chronic kidney disease stage 4.  Any questions? Keep being amazing!!  Thank you for allowing me to participate in your care.  I appreciate you. Kindest regards, Jiovanna Frei

## 2021-12-13 LAB — COMPREHENSIVE METABOLIC PANEL
ALT: 13 IU/L (ref 0–32)
AST: 18 IU/L (ref 0–40)
Albumin/Globulin Ratio: 1.8 (ref 1.2–2.2)
Albumin: 4.6 g/dL (ref 3.9–4.9)
Alkaline Phosphatase: 153 IU/L — ABNORMAL HIGH (ref 44–121)
BUN/Creatinine Ratio: 15 (ref 12–28)
BUN: 33 mg/dL — ABNORMAL HIGH (ref 8–27)
Bilirubin Total: 0.3 mg/dL (ref 0.0–1.2)
CO2: 18 mmol/L — ABNORMAL LOW (ref 20–29)
Calcium: 9.8 mg/dL (ref 8.7–10.3)
Chloride: 106 mmol/L (ref 96–106)
Creatinine, Ser: 2.2 mg/dL — ABNORMAL HIGH (ref 0.57–1.00)
Globulin, Total: 2.5 g/dL (ref 1.5–4.5)
Glucose: 106 mg/dL — ABNORMAL HIGH (ref 70–99)
Potassium: 4.7 mmol/L (ref 3.5–5.2)
Sodium: 142 mmol/L (ref 134–144)
Total Protein: 7.1 g/dL (ref 6.0–8.5)
eGFR: 25 mL/min/{1.73_m2} — ABNORMAL LOW (ref >59)

## 2021-12-13 LAB — LIPID PANEL W/O CHOL/HDL RATIO
Cholesterol, Total: 143 mg/dL (ref 100–199)
HDL: 46 mg/dL (ref >39)
LDL Chol Calc (NIH): 82 mg/dL (ref 0–99)
Triglycerides: 77 mg/dL (ref 0–149)
VLDL Cholesterol Cal: 15 mg/dL (ref 5–40)

## 2021-12-13 LAB — CBC WITH DIFFERENTIAL/PLATELET
Basophils Absolute: 0.1 10*3/uL (ref 0.0–0.2)
Basos: 1 %
EOS (ABSOLUTE): 0.3 10*3/uL (ref 0.0–0.4)
Eos: 4 %
Hematocrit: 46.5 % (ref 34.0–46.6)
Hemoglobin: 15.4 g/dL (ref 11.1–15.9)
Immature Grans (Abs): 0 10*3/uL (ref 0.0–0.1)
Immature Granulocytes: 0 %
Lymphocytes Absolute: 1.5 10*3/uL (ref 0.7–3.1)
Lymphs: 20 %
MCH: 28.7 pg (ref 26.6–33.0)
MCHC: 33.1 g/dL (ref 31.5–35.7)
MCV: 87 fL (ref 79–97)
Monocytes Absolute: 0.4 10*3/uL (ref 0.1–0.9)
Monocytes: 5 %
Neutrophils Absolute: 5.1 10*3/uL (ref 1.4–7.0)
Neutrophils: 70 %
Platelets: 188 10*3/uL (ref 150–450)
RBC: 5.37 x10E6/uL — ABNORMAL HIGH (ref 3.77–5.28)
RDW: 13.5 % (ref 11.7–15.4)
WBC: 7.3 10*3/uL (ref 3.4–10.8)

## 2021-12-13 LAB — PTH, INTACT AND CALCIUM: PTH: 45 pg/mL (ref 15–65)

## 2022-02-05 ENCOUNTER — Encounter: Payer: Self-pay | Admitting: Nurse Practitioner

## 2022-06-07 ENCOUNTER — Ambulatory Visit: Payer: 59 | Admitting: Nurse Practitioner

## 2022-06-07 DIAGNOSIS — I1310 Hypertensive heart and chronic kidney disease without heart failure, with stage 1 through stage 4 chronic kidney disease, or unspecified chronic kidney disease: Secondary | ICD-10-CM

## 2022-06-07 DIAGNOSIS — E6609 Other obesity due to excess calories: Secondary | ICD-10-CM

## 2022-06-07 DIAGNOSIS — E7800 Pure hypercholesterolemia, unspecified: Secondary | ICD-10-CM

## 2022-06-07 DIAGNOSIS — N184 Chronic kidney disease, stage 4 (severe): Secondary | ICD-10-CM

## 2022-06-11 ENCOUNTER — Ambulatory Visit: Payer: 59 | Admitting: Nurse Practitioner

## 2022-06-18 ENCOUNTER — Other Ambulatory Visit: Payer: Self-pay | Admitting: Nurse Practitioner

## 2022-06-19 NOTE — Telephone Encounter (Signed)
Unable to refill per protocol, Rx request is too soon. Last refill 06/08/21  for 90 and 4 refills.  Requested Prescriptions  Pending Prescriptions Disp Refills   metoprolol tartrate (LOPRESSOR) 100 MG tablet [Pharmacy Med Name: METOPROLOL TARTRATE 100 MG TAB] 60 tablet 0    Sig: Take 1 tablet (100 mg total) by mouth 2 (two) times daily.     Cardiovascular:  Beta Blockers Failed - 06/18/2022  6:29 PM      Failed - Valid encounter within last 6 months    Recent Outpatient Visits           6 months ago Chronic kidney disease, stage 4 (severe) ([place])   [place], Broad[place] T, NP   10 months ago Bacterial conjunctivitis of right eye   [place] Mecum, Erin E, PA-C   1 year ago Hypertensive heart and kidney disease without HF and with CKD stage IV ([place])   [place], Henrine Screws T, NP   1 year ago Acute cough   [place], Henrine Screws T, NP   1 year ago [place], NP       Future Appointments             In 1 month Cannady, Floyd T, NP [place]            Passed - Last BP in normal range    BP Readings from Last 1 Encounters:  12/11/21 132/74         Passed - Last Heart Rate in normal range    Pulse Readings from Last 1 Encounters:  12/11/21 (!) 51          NIFEdipine (PROCARDIA XL/NIFEDICAL-XL) 90 MG 24 hr tablet [Pharmacy Med Name: NIFEDIPINE ER 90 MG TABLET] 30 tablet 0    Sig: Take 1 tablet (90 mg total) by mouth daily.     Cardiovascular: Calcium Channel Blockers 2 Failed - 06/18/2022  6:29 PM      Failed - Valid encounter within last 6 months    Recent Outpatient Visits           6 months ago Chronic kidney disease, stage 4 (severe) ([place])   [place], Henrine Screws T, NP   10 months ago Bacterial conjunctivitis of right eye   [place] Mecum, Erin E, PA-C   1 year ago Hypertensive heart and kidney disease without HF and with CKD stage IV ([place])   [place], Henrine Screws T, NP   1 year ago Acute cough   [place], Henrine Screws T, NP   1 year ago [place], NP       Future Appointments             In 1 month Cannady, [place] T, NP [place]            Passed - Last BP in normal range    BP Readings from Last 1 Encounters:  12/11/21 132/74         Passed - Last Heart Rate in normal range    Pulse Readings from Last 1 Encounters:  12/11/21 (!) 51

## 2022-06-21 ENCOUNTER — Other Ambulatory Visit: Payer: Self-pay | Admitting: Nurse Practitioner

## 2022-06-21 NOTE — Telephone Encounter (Signed)
Pts spouse came in and stated that the pt only has 3 more pills left and would like to see if it can be called in today.

## 2022-06-21 NOTE — Telephone Encounter (Signed)
Called and spoke with Pharmacy staff at [facility] in regard to a refill for metoprolol 100 mg and nifedipine 90mg . They state that insurance only pays for a year supply and last refill was 06/08/21 for 90 days and 4 refills, a little over a year. The pharmacy staff state that there a no more refills for Nifedipine and the Rx for Metoprolol is expired. Please advise new Rx for medication refill.

## 2022-07-11 ENCOUNTER — Other Ambulatory Visit: Payer: Self-pay | Admitting: Nurse Practitioner

## 2022-07-11 NOTE — Telephone Encounter (Signed)
Requested Prescriptions  Refused Prescriptions Disp Refills   pravastatin (PRAVACHOL) 40 MG tablet [Pharmacy Med Name: PRAVASTATIN SODIUM 40 MG TAB] 30 tablet 0    Sig: Take 1 tablet (40 mg total) by mouth daily.     Cardiovascular:  Antilipid - Statins Failed - 07/11/2022  9:20 AM      Failed - Lipid Panel in normal range within the last 12 months    Cholesterol, Total  Date Value Ref Range Status  12/11/2021 143 100 - 199 mg/dL Final   LDL Chol Calc (NIH)  Date Value Ref Range Status  12/11/2021 82 0 - 99 mg/dL Final   HDL  Date Value Ref Range Status  12/11/2021 46 >39 mg/dL Final   Triglycerides  Date Value Ref Range Status  12/11/2021 77 0 - 149 mg/dL Final         Passed - Patient is not pregnant      Passed - Valid encounter within last 12 months    Recent Outpatient Visits           7 months ago Chronic kidney disease, stage 4 (severe) (HCC)   [place] [facility] Cannady, Jolene T, NP   11 months ago Bacterial conjunctivitis of right eye   [place] [facility] Mecum, Erin E, PA-C   1 year ago Hypertensive heart and kidney disease without HF and with CKD stage IV (HCC)   [place] [facility] [place], Corrie Dandy T, NP   1 year ago Acute cough   [place] [facility] [place], Corrie Dandy T, NP   1 year ago Laryngitis   [place] [facility] Larae Grooms, NP       Future Appointments             In 3 weeks Cannady, Dorie Rank, NP  [facility]

## 2022-07-28 NOTE — Patient Instructions (Signed)
Chronic Kidney Disease, Adult Chronic kidney disease is when lasting damage happens to the kidneys slowly over a long time. The kidneys help to: Make pee (urine). Make hormones. Keep the right amount of fluids and chemicals in the body. Most often, this disease does not go away. You must take steps to help keep the kidney damage from getting worse. If steps are not taken, the kidneys might stop working forever. What are the causes? Diabetes. High blood pressure. Diseases that affect the heart and blood vessels. Other kidney diseases. Diseases of the body's disease-fighting system. A problem with the flow of pee. Infections of the organs that make pee, store it, and take it out of the body. Swelling or irritation of your blood vessels. What increases the risk? Getting older. Having someone in your family who has kidney disease or kidney failure. Having a disease caused by genes. Taking medicines often that harm the kidneys. Being near or having contact with harmful substances. Being very overweight. Using tobacco now or in the past. What are the signs or symptoms? Feeling very tired. Having a swollen face, legs, ankles, or feet. Feeling like you may vomit or vomiting. Not feeling hungry. Being confused or not able to focus. Twitches and cramps in the leg muscles or other muscles. Dry, itchy skin. A taste of metal in your mouth. Making less pee, or making more pee. Shortness of breath. Trouble sleeping. You may also become anemic or get weak bones. Anemic means there is not enough red blood cells or hemoglobin in your blood. You may get symptoms slowly. You may not notice them until the kidney damage gets very bad. How is this treated? Often, there is no cure for this disease. Treatment can help with symptoms and help keep the disease from getting worse. You may need to: Avoid alcohol. Avoid foods that are high in salt, potassium, phosphorous, and protein. Take medicines for  symptoms and to help control other conditions. Have dialysis. This treatment gets harmful waste out of your body. Treat other problems that cause your kidney disease or make it worse. Follow these instructions at home: Medicines Take over-the-counter and prescription medicines only as told by your doctor. Do not take any new medicines, vitamins, or supplements unless your doctor says it is okay. Lifestyle  Do not smoke or use any products that contain nicotine or tobacco. If you need help quitting, ask your doctor. If you drink alcohol: Limit how much you use to: 0-1 drink a day for women who are not pregnant. 0-2 drinks a day for men. Know how much alcohol is in your drink. In the [place] one drink equals one 12 oz bottle of beer (355 mL), one 5 oz glass of wine (148 mL), or one 1 oz glass of hard liquor (44 mL). Stay at a healthy weight. If you need help losing weight, ask your doctor. General instructions  Follow instructions from your doctor about what you cannot eat or drink. Track your blood pressure at home. Tell your doctor about any changes. If you have diabetes, track your blood sugar. Exercise at least 30 minutes a day, 5 days a week. Keep your shots (vaccinations) up to date. Keep all follow-up visits. Where to find more information American Association of Kidney Patients: www.aakp.org National Kidney Foundation: www.kidney.org American Kidney Fund: www.akfinc.org Life Options: www.lifeoptions.org Kidney School: www.kidneyschool.org Contact a doctor if: Your symptoms get worse. You get new symptoms. Get help right away if: You get symptoms of end-stage kidney disease. These   include: Headaches. Losing feeling in your hands or feet. Easy bruising. Having hiccups often. Chest pain. Shortness of breath. Lack of menstrual periods, in women. You have a fever. You make less pee than normal. You have pain or you bleed when you pee or poop. These symptoms may be an  emergency. Get help right away. Call your local emergency services (911 in the [place] Do not wait to see if the symptoms will go away. Do not drive yourself to the hospital. Summary Chronic kidney disease is when lasting damage happens to the kidneys slowly over a long time. Causes of this disease include diabetes and high blood pressure. Often, there is no cure for this disease. Treatment can help symptoms and help keep the disease from getting worse. Treatment may involve lifestyle changes, medicines, and dialysis. This information is not intended to replace advice given to you by your health care provider. Make sure you discuss any questions you have with your health care provider. Document Revised: 06/16/2019 Document Reviewed: 06/16/2019 Elsevier Patient Education  2023 Elsevier Inc.  

## 2022-08-01 ENCOUNTER — Ambulatory Visit: Payer: 59 | Admitting: Nurse Practitioner

## 2022-08-01 ENCOUNTER — Encounter: Payer: Self-pay | Admitting: Nurse Practitioner

## 2022-08-01 VITALS — BP 129/73 | HR 52 | Temp 97.5°F | Wt 161.4 lb

## 2022-08-01 DIAGNOSIS — E7800 Pure hypercholesterolemia, unspecified: Secondary | ICD-10-CM

## 2022-08-01 DIAGNOSIS — Z6832 Body mass index (BMI) 32.0-32.9, adult: Secondary | ICD-10-CM

## 2022-08-01 DIAGNOSIS — E6609 Other obesity due to excess calories: Secondary | ICD-10-CM | POA: Diagnosis not present

## 2022-08-01 DIAGNOSIS — N184 Chronic kidney disease, stage 4 (severe): Secondary | ICD-10-CM

## 2022-08-01 DIAGNOSIS — I1310 Hypertensive heart and chronic kidney disease without heart failure, with stage 1 through stage 4 chronic kidney disease, or unspecified chronic kidney disease: Secondary | ICD-10-CM | POA: Diagnosis not present

## 2022-08-01 MED ORDER — SODIUM BICARBONATE 650 MG PO TABS: 650.0000 mg | ORAL_TABLET | Freq: Two times a day (BID) | ORAL | 4 refills | Status: DC

## 2022-08-01 MED ORDER — PRAVASTATIN SODIUM 40 MG PO TABS: 40.0000 mg | ORAL_TABLET | Freq: Every day | ORAL | 4 refills | Status: DC

## 2022-08-01 MED ORDER — PREDNISONE 10 MG PO TABS: ORAL_TABLET | ORAL | 0 refills | Status: DC

## 2022-08-01 NOTE — Assessment & Plan Note (Signed)
Chronic, stable.  BP at goal in office today.  Continue current medication regimen and adjust as needed.  Has not been back to see [facility] nephrology due to cost, recommend she return for visit when able -- will monitor here at this time.  Recommend she check BP at home at least 3 mornings a week and document for provider + focus on DASH diet.  LABS: refuses all labs today.  Return in 6 months.

## 2022-08-01 NOTE — Assessment & Plan Note (Signed)
BMI 30.50, has lost 26 pounds since October 2022.  Recommended eating smaller high protein, low fat meals more frequently and exercising 30 mins a day 5 times a week with a goal of 10-15lb weight loss in the next 3 months. Patient voiced their understanding and motivation to adhere to these recommendations.

## 2022-08-01 NOTE — Assessment & Plan Note (Signed)
Chronic, stable.  Continue current medication regimen and adjust as needed.  Obtain lipid panel next visit.

## 2022-08-01 NOTE — Assessment & Plan Note (Signed)
Followed by [facility] nephrology in past at [facility] -- reports can not afford specialists at this time, recommend she return to visit or change to local nephrology group -- refuses referral at this time.  Will obtain all labs: refuses all labs today.  If worsening labs will get her in sooner to nephrology group of her preference.

## 2022-08-01 NOTE — Progress Notes (Signed)
BP 129/73   Pulse (!) 52   Temp (!) 97.5 F (36.4 C) (Oral)   Wt 161 lb 6.4 oz (73.2 kg)   LMP  (LMP Unknown)   SpO2 98%   BMI 30.50 kg/m    Subjective:    Patient ID: Emma Sullivan, female    DOB: 1960/05/01, 62 y.o.   MRN: [id number]  HPI: Emma Sullivan is a 62 y.o. female  Chief Complaint  Patient presents with   Hypertension   Hyperlipidemia   Chronic Kidney Disease   HYPERTENSION / HYPERLIPIDEMIA Taking Pravastatin & Metoprolol & Procardia + Sodium Bicarb. Satisfied with current treatment? yes Duration of hypertension: chronic BP monitoring frequency: not checking BP range:  BP medication side effects: no Duration of hyperlipidemia: chronic Cholesterol medication side effects: no Cholesterol supplements: none Medication compliance: good compliance Aspirin: no Recent stressors: no Recurrent headaches: no Visual changes: no Palpitations: no Dyspnea: no Chest pain: no Lower extremity edema: no Dizzy/lightheaded: no  The 10-year ASCVD risk score (Arnett DK, et al., 2019) is: 6.1%   Values used to calculate the score:     Age: 60 years     Sex: Female     Is Non-Hispanic African American: No     Diabetic: No     Tobacco smoker: No     Systolic Blood Pressure: 129 mmHg     Is BP treated: Yes     HDL Cholesterol: 46 mg/dL     Total Cholesterol: 143 mg/dL  CHRONIC KIDNEY DISEASE Followed by [facility] nephrology and last seen 10/17/20, at this time can not afford to return to specialists she reports. Continues on sodium bicarbonate. Refuses any labs today due to cost.  Would like steroids for rash -- gets rash annually this time of year to arms and face.  Steroids clear this. CKD status: stable Medications renally dose: yes Previous renal evaluation: yes Pneumovax: Refuses Influenza Vaccine:  Refuses     08/01/2022    8:16 AM 12/11/2021    8:12 AM 06/08/2021    9:07 AM 11/01/2020    3:02 PM 03/28/2020    3:44 PM  Depression screen PHQ 2/9   Decreased Interest 0 1 1 3 3   Down, Depressed, Hopeless 0 1 1 3 3   PHQ - 2 Score 0 2 2 6 6   Altered sleeping 0 1 1 1  0  Tired, decreased energy 0 1 1 1 2   Change in appetite 0 0 1 3 2   Feeling bad or failure about yourself  0 0 1 3 2   Trouble concentrating 0 0 1 1 2   Moving slowly or fidgety/restless 0 0 0 1 2  Suicidal thoughts 0 0 0 3 0  PHQ-9 Score 0 4 7 19 16   Difficult doing work/chores Not difficult at all  Not difficult at all Not difficult at all       08/01/2022    8:16 AM 06/08/2021    9:09 AM 11/01/2020    3:04 PM  GAD 7 : Generalized Anxiety Score  Nervous, Anxious, on Edge 0 1 1  Control/stop worrying 0 1 3  Worry too much - different things 0 1 3  Trouble relaxing 0 1 1  Restless 0 1 1  Easily annoyed or irritable 0 1 3  Afraid - awful might happen 0 1 1  Total GAD 7 Score 0 7 13  Anxiety Difficulty  Somewhat difficult Not difficult at all   Relevant past medical, surgical, family and social history reviewed  and updated as indicated. Interim medical history since our last visit reviewed. Allergies and medications reviewed and updated.  Review of Systems  Constitutional:  Negative for activity change, appetite change, diaphoresis, fatigue and fever.  Respiratory:  Negative for cough, chest tightness and shortness of breath.   Cardiovascular:  Negative for chest pain, palpitations and leg swelling.  Gastrointestinal:  Negative for abdominal distention, abdominal pain, constipation, diarrhea, nausea and vomiting.  Endocrine: Negative for cold intolerance and heat intolerance.  Neurological:  Negative for dizziness, syncope, weakness, light-headedness, numbness and headaches.  Psychiatric/Behavioral: Negative.      Per HPI unless specifically indicated above     Objective:    BP 129/73   Pulse (!) 52   Temp (!) 97.5 F (36.4 C) (Oral)   Wt 161 lb 6.4 oz (73.2 kg)   LMP  (LMP Unknown)   SpO2 98%   BMI 30.50 kg/m   Wt Readings from Last 3 Encounters:   08/01/22 161 lb 6.4 oz (73.2 kg)  12/11/21 167 lb 12.8 oz (76.1 kg)  06/08/21 174 lb (78.9 kg)    Physical Exam Vitals and nursing note reviewed.  Constitutional:      General: She is awake. She is not in acute distress.    Appearance: She is well-developed and well-groomed. She is obese. She is not ill-appearing or toxic-appearing.  HENT:     Head: Normocephalic.     Right Ear: Hearing normal.     Left Ear: Hearing normal.  Eyes:     General: Lids are normal.        Right eye: No discharge.        Left eye: No discharge.     Conjunctiva/sclera: Conjunctivae normal.     Pupils: Pupils are equal, round, and reactive to light.  Neck:     Thyroid: No thyromegaly.     Vascular: No carotid bruit.  Cardiovascular:     Rate and Rhythm: Normal rate and regular rhythm.     Heart sounds: Normal heart sounds. No murmur heard.    No gallop.  Pulmonary:     Effort: Pulmonary effort is normal. No accessory muscle usage or respiratory distress.     Breath sounds: Normal breath sounds.  Abdominal:     General: Bowel sounds are normal.     Palpations: Abdomen is soft. There is no hepatomegaly or splenomegaly.  Musculoskeletal:     Cervical back: Normal range of motion and neck supple.     Right lower leg: No edema.     Left lower leg: No edema.  Skin:    General: Skin is warm and dry.  Neurological:     Mental Status: She is alert and oriented to person, place, and time.  Psychiatric:        Attention and Perception: Attention normal.        Mood and Affect: Mood normal.        Behavior: Behavior normal. Behavior is cooperative.        Thought Content: Thought content normal.        Judgment: Judgment normal.    Results for orders placed or performed in visit on 12/11/21  CBC with Differential/Platelet  Result Value Ref Range   WBC 7.3 3.4 - 10.8 x10E3/uL   RBC 5.37 (H) 3.77 - 5.28 x10E6/uL   Hemoglobin 15.4 11.1 - 15.9 g/dL   Hematocrit 96.0 45.4 - 46.6 %   MCV 87 79 - 97 fL    MCH 28.7 26.6 -  33.0 pg   MCHC 33.1 31.5 - 35.7 g/dL   RDW 16.1 09.6 - 04.5 %   Platelets 188 150 - 450 x10E3/uL   Neutrophils 70 Not Estab. %   Lymphs 20 Not Estab. %   Monocytes 5 Not Estab. %   Eos 4 Not Estab. %   Basos 1 Not Estab. %   Neutrophils Absolute 5.1 1.4 - 7.0 x10E3/uL   Lymphocytes Absolute 1.5 0.7 - 3.1 x10E3/uL   Monocytes Absolute 0.4 0.1 - 0.9 x10E3/uL   EOS (ABSOLUTE) 0.3 0.0 - 0.4 x10E3/uL   Basophils Absolute 0.1 0.0 - 0.2 x10E3/uL   Immature Granulocytes 0 Not Estab. %   Immature Grans (Abs) 0.0 0.0 - 0.1 x10E3/uL  Comprehensive metabolic panel  Result Value Ref Range   Glucose 106 (H) 70 - 99 mg/dL   BUN 33 (H) 8 - 27 mg/dL   Creatinine, Ser 4.09 (H) 0.57 - 1.00 mg/dL   eGFR 25 (L) >81 XB/JYN/8.29   BUN/Creatinine Ratio 15 12 - 28   Sodium 142 134 - 144 mmol/L   Potassium 4.7 3.5 - 5.2 mmol/L   Chloride 106 96 - 106 mmol/L   CO2 18 (L) 20 - 29 mmol/L   Calcium 9.8 8.7 - 10.3 mg/dL   Total Protein 7.1 6.0 - 8.5 g/dL   Albumin 4.6 3.9 - 4.9 g/dL   Globulin, Total 2.5 1.5 - 4.5 g/dL   Albumin/Globulin Ratio 1.8 1.2 - 2.2   Bilirubin Total 0.3 0.0 - 1.2 mg/dL   Alkaline Phosphatase 153 (H) 44 - 121 IU/L   AST 18 0 - 40 IU/L   ALT 13 0 - 32 IU/L  Lipid Panel w/o Chol/HDL Ratio  Result Value Ref Range   Cholesterol, Total 143 100 - 199 mg/dL   Triglycerides 77 0 - 149 mg/dL   HDL 46 >56 mg/dL   VLDL Cholesterol Cal 15 5 - 40 mg/dL   LDL Chol Calc (NIH) 82 0 - 99 mg/dL  PTH, intact and calcium  Result Value Ref Range   PTH 45 15 - 65 pg/mL   PTH Interp Comment       Assessment & Plan:   Problem List Items Addressed This Visit       Cardiovascular and Mediastinum   Hypertensive heart and kidney disease without HF and with CKD stage IV (HCC)    Chronic, stable.  BP at goal in office today.  Continue current medication regimen and adjust as needed.  Has not been back to see [facility] nephrology due to cost, recommend she return for visit when able  -- will monitor here at this time.  Recommend she check BP at home at least 3 mornings a week and document for provider + focus on DASH diet.  LABS: refuses all labs today.  Return in 6 months.      Relevant Medications   pravastatin (PRAVACHOL) 40 MG tablet     Genitourinary   Chronic kidney disease, stage 4 (severe) (HCC) - Primary    Followed by [facility] nephrology in past at [facility] -- reports can not afford specialists at this time, recommend she return to visit or change to local nephrology group -- refuses referral at this time.  Will obtain all labs: refuses all labs today.  If worsening labs will get her in sooner to nephrology group of her preference.        Other   Hyperlipemia    Chronic, stable.  Continue current medication regimen and adjust  as needed.  Obtain lipid panel next visit.      Relevant Medications   pravastatin (PRAVACHOL) 40 MG tablet   Obesity    BMI 30.50, has lost 26 pounds since October 2022.  Recommended eating smaller high protein, low fat meals more frequently and exercising 30 mins a day 5 times a week with a goal of 10-15lb weight loss in the next 3 months. Patient voiced their understanding and motivation to adhere to these recommendations.       Relevant Medications   sodium bicarbonate 650 MG tablet     Follow up plan: Return in about 6 months (around 02/01/2023) for CKD, HTN/HLD.

## 2023-02-04 ENCOUNTER — Ambulatory Visit: Payer: 59 | Admitting: Nurse Practitioner

## 2023-02-04 DIAGNOSIS — E6609 Other obesity due to excess calories: Secondary | ICD-10-CM

## 2023-02-04 DIAGNOSIS — E7800 Pure hypercholesterolemia, unspecified: Secondary | ICD-10-CM

## 2023-02-04 DIAGNOSIS — N184 Chronic kidney disease, stage 4 (severe): Secondary | ICD-10-CM

## 2023-03-14 ENCOUNTER — Ambulatory Visit: Payer: 59 | Admitting: Nurse Practitioner

## 2023-03-29 NOTE — Patient Instructions (Signed)
 Food Basics for Chronic Kidney Disease Chronic kidney disease (CKD) is when your kidneys are not working well. They cannot remove waste, fluids, and other substances from your blood the way they should. These substances can build up, which can worsen kidney damage and affect how your body works. Eating certain foods can lead to a buildup of these substances. Changing your diet can help prevent more kidney damage. Diet changes may also delay dialysis or even keep you from needing it. What nutrients should I limit? Work with your treatment team and a food expert (dietitian) to make a meal plan that's right for you. Foods you can eat and foods you should limit or avoid will depend on the stage of your kidney disease and any other health conditions you have. The items listed below are not a complete list. Talk with your dietitian to learn what is best for you. Potassium Potassium affects how steadily your heart beats. Too much potassium in your blood can cause an irregular heartbeat or even a heart attack. You may need to limit foods that are high in potassium, such as: Liquid milk and soy milk. Salt substitutes that contain potassium. Fruits like bananas, apricots, nectarines, melon, prunes, raisins, kiwi, and oranges. Vegetables, such as potatoes, sweet potatoes, yams, tomatoes, leafy greens, beets, avocado, pumpkin, and winter squash. Beans, like lima beans. Nuts. Phosphorus Phosphorus is a mineral found in your bones. You need a balance between calcium and phosphorus to build and maintain healthy bones. Too much added phosphorus from the foods you eat can pull calcium from your bones. Losing calcium can make your bones weak and more likely to break. Too much phosphorus can also make your skin itch. You may need to limit foods that are high in phosphorus or that have added phosphorus, such as: Liquid milk and dairy products. Dark-colored sodas or soft drinks. Bran cereals and  oatmeal. Protein  Protein helps you make and keep muscle. Protein also helps to repair your body's cells and tissues. One of the natural breakdown products of protein is a waste product called urea. When your kidneys are not working well, they cannot remove types of waste like urea. Reducing protein in your diet can help keep urea from building up in your blood. Depending on your stage of kidney disease, you may need to eat smaller portions of foods that are high in protein. Sources of animal protein include: Meat (all types). Fish and seafood. Poultry. Eggs. Dairy. Other protein foods include: Beans and legumes. Nuts and nut butter. Soy, like tofu.  Sodium Salt (sodium) helps to keep a healthy balance of fluids in your body. Too much salt can increase your blood pressure, which can harm your heart and lungs. Extra salt can also cause your body to keep too much fluid, making your kidneys work harder. You may need to limit or avoid foods that are high in salt, such as: Salt seasonings. Soy and teriyaki sauce. Packaged, precooked, cured, or processed meats, such as sausages or meat loaves. Sardines. Salted crackers and snack foods. Fast food. Canned soups and most canned foods. Pickled foods. Vegetable juice. Boxed mixes or ready-to-eat boxed meals and side dishes. Bottled dressings, sauces, and marinades. Talk with your dietitian about how much potassium, phosphorus, protein, and salt you may have each day. Helpful tips Read food labels  Check the amount of salt in foods. Limit foods that have salt or sodium listed among the first five ingredients. Try to eat low-salt foods. Check the ingredient list  for added phosphorus or potassium. "Phos" in an ingredient is a sign that phosphorus has been added. Do not buy foods that are calcium-enriched or that have calcium added to them (are fortified). Buy canned vegetables and beans that say "no salt added" and rinse them before  eating. Lifestyle Limit the amount of protein you eat from animal sources each day. Focus on protein from plant sources, like tofu and dried beans, peas, and lentils. Do not add salt to food when cooking or before eating. Do not eat star fruit. It can be toxic for people with kidney problems. Talk with your health care provider before taking any vitamin or mineral supplements. If told by your health care provider, track how much liquid you drink so you can avoid drinking too much. You may need to include foods you eat that are made mostly from water, like gelatin, ice cream, soups, and juicy fruits and vegetables. If you have diabetes: If you have diabetes (diabetes mellitus) and CKD, you need to keep your blood sugar (glucose) in the target range recommended by your health care provider. Follow your diabetes management plan. This may include: Checking your blood glucose regularly. Taking medicines by mouth, or taking insulin, or both. Exercising for at least 30 minutes on 5 or more days each week, or as told by your health care provider. Tracking how many servings of carbohydrates you eat at each meal. Not using orange juice to treat low blood sugars. Instead, use apple juice, cranberry juice, or clear soda. You may be given guidelines on what foods and nutrients you may eat, and how much you can have each day. This depends on your stage of kidney disease and whether you have high blood pressure (hypertension). Follow the meal plan your dietitian gives you. To learn more: [employer] of Diabetes and Digestive and Kidney Diseases: [employer]: kidney.org Summary Chronic kidney disease (CKD) is when your kidneys are not working well. They cannot remove waste, fluids, and other substances from your blood the way they should. These substances can build up, which can worsen kidney damage and affect how your body works. Changing your diet can help prevent more  kidney damage. Diet changes may also delay dialysis or even keep you from needing it. Diet changes are different for each person with CKD. Work with a dietitian to set up a meal plan that is right for you. This information is not intended to replace advice given to you by your health care provider. Make sure you discuss any questions you have with your health care provider. Document Revised: 06/28/2021 Document Reviewed: 07/05/2019 Elsevier Patient Education  2024 [employer].

## 2023-04-04 ENCOUNTER — Encounter: Payer: Self-pay | Admitting: Nurse Practitioner

## 2023-04-04 ENCOUNTER — Ambulatory Visit (INDEPENDENT_AMBULATORY_CARE_PROVIDER_SITE_OTHER): Payer: Medicare Other | Admitting: Nurse Practitioner

## 2023-04-04 VITALS — BP 131/75 | HR 56 | Temp 97.4°F | Ht 61.0 in | Wt 165.6 lb

## 2023-04-04 DIAGNOSIS — N184 Chronic kidney disease, stage 4 (severe): Secondary | ICD-10-CM

## 2023-04-04 DIAGNOSIS — E66811 Obesity, class 1: Secondary | ICD-10-CM

## 2023-04-04 DIAGNOSIS — E7800 Pure hypercholesterolemia, unspecified: Secondary | ICD-10-CM | POA: Diagnosis not present

## 2023-04-04 DIAGNOSIS — I1310 Hypertensive heart and chronic kidney disease without heart failure, with stage 1 through stage 4 chronic kidney disease, or unspecified chronic kidney disease: Secondary | ICD-10-CM

## 2023-04-04 DIAGNOSIS — E6609 Other obesity due to excess calories: Secondary | ICD-10-CM

## 2023-04-04 DIAGNOSIS — Z6832 Body mass index (BMI) 32.0-32.9, adult: Secondary | ICD-10-CM

## 2023-04-04 MED ORDER — METOPROLOL TARTRATE 100 MG PO TABS: 100.0000 mg | ORAL_TABLET | Freq: Two times a day (BID) | ORAL | 4 refills | Status: DC

## 2023-04-04 MED ORDER — NIFEDIPINE ER OSMOTIC RELEASE 90 MG PO TB24: 90.0000 mg | ORAL_TABLET | Freq: Every day | ORAL | 4 refills | Status: DC

## 2023-04-04 MED ORDER — PRAVASTATIN SODIUM 40 MG PO TABS: 40.0000 mg | ORAL_TABLET | Freq: Every day | ORAL | 4 refills | Status: DC

## 2023-04-04 MED ORDER — SODIUM BICARBONATE 650 MG PO TABS: 650.0000 mg | ORAL_TABLET | Freq: Two times a day (BID) | ORAL | 4 refills | Status: DC

## 2023-04-04 NOTE — Assessment & Plan Note (Signed)
Chronic, stable.  Continue current medication regimen and adjust as needed.  Obtain lipid panel today. ?

## 2023-04-04 NOTE — Progress Notes (Signed)
 BP 131/75   Pulse (!) 56   Temp (!) 97.4 F (36.3 C) (Oral)   Ht 5' 1 (1.549 m)   Wt 165 lb 9.6 oz (75.1 kg)   LMP  (LMP Unknown)   SpO2 97%   BMI 31.29 kg/m    Subjective:    Patient ID: Emma Sullivan, female    DOB: 07/18/1960, 63 y.o.   MRN: [id number]  HPI: Emma Sullivan is a 63 y.o. female  Chief Complaint  Patient presents with   Chronic Kidney Disease   Hyperlipidemia   Hypertension   HYPERTENSION / HYPERLIPIDEMIA Continues on Pravastatin  & Metoprolol  & Procardia  + Sodium Bicarb. Satisfied with current treatment? yes Duration of hypertension: chronic BP monitoring frequency: not checking BP range:  BP medication side effects: no Duration of hyperlipidemia: chronic Cholesterol medication side effects: no Cholesterol supplements: none Medication compliance: good compliance Aspirin: no Recent stressors: no Recurrent headaches: no Visual changes: no Palpitations: no Dyspnea: no Chest pain: no Lower extremity edema: no Dizzy/lightheaded: no  The 10-year ASCVD risk score (Arnett DK, et al., 2019) is: 6.3%   Values used to calculate the score:     Age: 89 years     Sex: Female     Is Non-Hispanic African American: No     Diabetic: No     Tobacco smoker: No     Systolic Blood Pressure: 131 mmHg     Is BP treated: Yes     HDL Cholesterol: 46 mg/dL     Total Cholesterol: 143 mg/dL  CHRONIC KIDNEY DISEASE Followed by [facility] nephrology and last seen 10/17/20, due to cost and can not afford specialist visit. Continues on sodium bicarbonate .  CKD status: stable Medications renally dose: yes Previous renal evaluation: yes Pneumovax: Refuses Influenza Vaccine:  Refuses     04/04/2023   11:46 AM 08/01/2022    8:16 AM 12/11/2021    8:12 AM 06/08/2021    9:07 AM 11/01/2020    3:02 PM  Depression screen PHQ 2/9  Decreased Interest 0 0 1 1 3   Down, Depressed, Hopeless 0 0 1 1 3   PHQ - 2 Score 0 0 2 2 6   Altered sleeping 0 0 1 1 1   Tired, decreased  energy 0 0 1 1 1   Change in appetite 0 0 0 1 3  Feeling bad or failure about yourself  0 0 0 1 3  Trouble concentrating 0 0 0 1 1  Moving slowly or fidgety/restless 0 0 0 0 1  Suicidal thoughts 0 0 0 0 3  PHQ-9 Score 0 0 4 7 19   Difficult doing work/chores Not difficult at all Not difficult at all  Not difficult at all Not difficult at all      04/04/2023   11:46 AM 08/01/2022    8:16 AM 06/08/2021    9:09 AM 11/01/2020    3:04 PM  GAD 7 : Generalized Anxiety Score  Nervous, Anxious, on Edge 0 0 1 1  Control/stop worrying 0 0 1 3  Worry too much - different things 0 0 1 3  Trouble relaxing 0 0 1 1  Restless 0 0 1 1  Easily annoyed or irritable 0 0 1 3  Afraid - awful might happen 0 0 1 1  Total GAD 7 Score 0 0 7 13  Anxiety Difficulty   Somewhat difficult Not difficult at all   Relevant past medical, surgical, family and social history reviewed and updated as indicated. Interim medical history  since our last visit reviewed. Allergies and medications reviewed and updated.  Review of Systems  Constitutional:  Negative for activity change, appetite change, diaphoresis, fatigue and fever.  Respiratory:  Negative for cough, chest tightness and shortness of breath.   Cardiovascular:  Negative for chest pain, palpitations and leg swelling.  Gastrointestinal:  Negative for abdominal distention, abdominal pain, constipation, diarrhea, nausea and vomiting.  Endocrine: Negative for cold intolerance and heat intolerance.  Neurological:  Negative for dizziness, syncope, weakness, light-headedness, numbness and headaches.  Psychiatric/Behavioral: Negative.      Per HPI unless specifically indicated above     Objective:    BP 131/75   Pulse (!) 56   Temp (!) 97.4 F (36.3 C) (Oral)   Ht 5' 1 (1.549 m)   Wt 165 lb 9.6 oz (75.1 kg)   LMP  (LMP Unknown)   SpO2 97%   BMI 31.29 kg/m   Wt Readings from Last 3 Encounters:  04/04/23 165 lb 9.6 oz (75.1 kg)  08/01/22 161 lb 6.4 oz (73.2  kg)  12/11/21 167 lb 12.8 oz (76.1 kg)    Physical Exam Vitals and nursing note reviewed.  Constitutional:      General: She is awake. She is not in acute distress.    Appearance: She is well-developed and well-groomed. She is obese. She is not ill-appearing or toxic-appearing.  HENT:     Head: Normocephalic.     Right Ear: Hearing normal.     Left Ear: Hearing normal.  Eyes:     General: Lids are normal.        Right eye: No discharge.        Left eye: No discharge.     Conjunctiva/sclera: Conjunctivae normal.     Pupils: Pupils are equal, round, and reactive to light.  Neck:     Thyroid : No thyromegaly.     Vascular: No carotid bruit.  Cardiovascular:     Rate and Rhythm: Normal rate and regular rhythm.     Heart sounds: Normal heart sounds. No murmur heard.    No gallop.  Pulmonary:     Effort: Pulmonary effort is normal. No accessory muscle usage or respiratory distress.     Breath sounds: Normal breath sounds.  Abdominal:     General: Bowel sounds are normal.     Palpations: Abdomen is soft. There is no hepatomegaly or splenomegaly.  Musculoskeletal:     Cervical back: Normal range of motion and neck supple.     Right lower leg: No edema.     Left lower leg: No edema.  Skin:    General: Skin is warm and dry.  Neurological:     Mental Status: She is alert and oriented to person, place, and time.  Psychiatric:        Attention and Perception: Attention normal.        Mood and Affect: Mood normal.        Behavior: Behavior normal. Behavior is cooperative.        Thought Content: Thought content normal.        Judgment: Judgment normal.    Results for orders placed or performed in visit on 12/11/21  CBC with Differential/Platelet   Collection Time: 12/11/21  8:21 AM  Result Value Ref Range   WBC 7.3 3.4 - 10.8 x10E3/uL   RBC 5.37 (H) 3.77 - 5.28 x10E6/uL   Hemoglobin 15.4 11.1 - 15.9 g/dL   Hematocrit 53.4 65.9 - 46.6 %   MCV 87  79 - 97 fL   MCH 28.7 26.6 -  33.0 pg   MCHC 33.1 31.5 - 35.7 g/dL   RDW 86.4 88.2 - 84.5 %   Platelets 188 150 - 450 x10E3/uL   Neutrophils 70 Not Estab. %   Lymphs 20 Not Estab. %   Monocytes 5 Not Estab. %   Eos 4 Not Estab. %   Basos 1 Not Estab. %   Neutrophils Absolute 5.1 1.4 - 7.0 x10E3/uL   Lymphocytes Absolute 1.5 0.7 - 3.1 x10E3/uL   Monocytes Absolute 0.4 0.1 - 0.9 x10E3/uL   EOS (ABSOLUTE) 0.3 0.0 - 0.4 x10E3/uL   Basophils Absolute 0.1 0.0 - 0.2 x10E3/uL   Immature Granulocytes 0 Not Estab. %   Immature Grans (Abs) 0.0 0.0 - 0.1 x10E3/uL  Comprehensive metabolic panel   Collection Time: 12/11/21  8:21 AM  Result Value Ref Range   Glucose 106 (H) 70 - 99 mg/dL   BUN 33 (H) 8 - 27 mg/dL   Creatinine, Ser 7.79 (H) 0.57 - 1.00 mg/dL   eGFR 25 (L) >40 fO/fpw/8.26   BUN/Creatinine Ratio 15 12 - 28   Sodium 142 134 - 144 mmol/L   Potassium 4.7 3.5 - 5.2 mmol/L   Chloride 106 96 - 106 mmol/L   CO2 18 (L) 20 - 29 mmol/L   Calcium 9.8 8.7 - 10.3 mg/dL   Total Protein 7.1 6.0 - 8.5 g/dL   Albumin 4.6 3.9 - 4.9 g/dL   Globulin, Total 2.5 1.5 - 4.5 g/dL   Albumin/Globulin Ratio 1.8 1.2 - 2.2   Bilirubin Total 0.3 0.0 - 1.2 mg/dL   Alkaline Phosphatase 153 (H) 44 - 121 IU/L   AST 18 0 - 40 IU/L   ALT 13 0 - 32 IU/L  Lipid Panel w/o Chol/HDL Ratio   Collection Time: 12/11/21  8:21 AM  Result Value Ref Range   Cholesterol, Total 143 100 - 199 mg/dL   Triglycerides 77 0 - 149 mg/dL   HDL 46 >60 mg/dL   VLDL Cholesterol Cal 15 5 - 40 mg/dL   LDL Chol Calc (NIH) 82 0 - 99 mg/dL  PTH, intact and calcium   Collection Time: 12/11/21  8:21 AM  Result Value Ref Range   PTH 45 15 - 65 pg/mL   PTH Interp Comment       Assessment & Plan:   Problem List Items Addressed This Visit       Cardiovascular and Mediastinum   Hypertensive heart and kidney disease without HF and with CKD stage IV (HCC)   Chronic, stable.  BP at goal in office today.  Continue current medication regimen and adjust as needed.   Has not been back to see [facility] nephrology due to cost, recommend she return for visit when able -- will monitor here at this time.  Recommend she check BP at home at least 3 mornings a week and document for provider + focus on DASH diet.  LABS: CBC, CMP, TSH.  Return in 6 months.      Relevant Medications   metoprolol  tartrate (LOPRESSOR ) 100 MG tablet   NIFEdipine  (PROCARDIA  XL/NIFEDICAL-XL) 90 MG 24 hr tablet   pravastatin  (PRAVACHOL ) 40 MG tablet   Other Relevant Orders   CBC with Differential/Platelet   TSH     Genitourinary   Chronic kidney disease, stage 4 (severe) (HCC) - Primary   Followed by [facility] nephrology in past at [facility], has not returned as unable to afford specialists at this  time, recommend she return to visit or change to local nephrology group -- refuses referral at this time.  Will obtain all labs today, she agrees with this.  If worsening labs will get her in sooner to nephrology group of her preference.      Relevant Orders   CBC with Differential/Platelet   Comprehensive metabolic panel   Parathyroid  hormone, intact (no Ca)     Other   Hyperlipemia   Chronic, stable.  Continue current medication regimen and adjust as needed.  Obtain lipid panel today.      Relevant Medications   metoprolol  tartrate (LOPRESSOR ) 100 MG tablet   NIFEdipine  (PROCARDIA  XL/NIFEDICAL-XL) 90 MG 24 hr tablet   pravastatin  (PRAVACHOL ) 40 MG tablet   Other Relevant Orders   Lipid Panel w/o Chol/HDL Ratio   Obesity   BMI 68.70.  Recommended eating smaller high protein, low fat meals more frequently and exercising 30 mins a day 5 times a week with a goal of 10-15lb weight loss in the next 3 months. Patient voiced their understanding and motivation to adhere to these recommendations.       Relevant Medications   sodium bicarbonate  650 MG tablet     Follow up plan: Return in about 6 months (around 10/02/2023) for HTN/HLD, CKD.

## 2023-04-04 NOTE — Assessment & Plan Note (Signed)
 Followed by [facility] nephrology in past at [facility], has not returned as unable to afford specialists at this time, recommend Emma Sullivan return to visit or change to local nephrology group -- refuses referral at this time.  Will obtain all labs today, Emma Sullivan agrees with this.  If worsening labs will get her in sooner to nephrology group of her preference.

## 2023-04-04 NOTE — Assessment & Plan Note (Signed)
 Chronic, stable.  BP at goal in office today.  Continue current medication regimen and adjust as needed.  Has not been back to see [facility] nephrology due to cost, recommend she return for visit when able -- will monitor here at this time.  Recommend she check BP at home at least 3 mornings a week and document for provider + focus on DASH diet.  LABS: CBC, CMP, TSH.  Return in 6 months.

## 2023-04-04 NOTE — Assessment & Plan Note (Signed)
BMI 31.29.  Recommended eating smaller high protein, low fat meals more frequently and exercising 30 mins a day 5 times a week with a goal of 10-15lb weight loss in the next 3 months. Patient voiced their understanding and motivation to adhere to these recommendations. ? ?

## 2023-04-05 LAB — CBC WITH DIFFERENTIAL/PLATELET
Basophils Absolute: 0.1 10*3/uL (ref 0.0–0.2)
Basos: 1 %
EOS (ABSOLUTE): 0.2 10*3/uL (ref 0.0–0.4)
Eos: 3 %
Hematocrit: 44.6 % (ref 34.0–46.6)
Hemoglobin: 14.8 g/dL (ref 11.1–15.9)
Immature Grans (Abs): 0 10*3/uL (ref 0.0–0.1)
Immature Granulocytes: 0 %
Lymphocytes Absolute: 1.9 10*3/uL (ref 0.7–3.1)
Lymphs: 21 %
MCH: 29.1 pg (ref 26.6–33.0)
MCHC: 33.2 g/dL (ref 31.5–35.7)
MCV: 88 fL (ref 79–97)
Monocytes Absolute: 0.4 10*3/uL (ref 0.1–0.9)
Monocytes: 5 %
Neutrophils Absolute: 6.1 10*3/uL (ref 1.4–7.0)
Neutrophils: 70 %
Platelets: 220 10*3/uL (ref 150–450)
RBC: 5.09 x10E6/uL (ref 3.77–5.28)
RDW: 13.1 % (ref 11.7–15.4)
WBC: 8.8 10*3/uL (ref 3.4–10.8)

## 2023-04-05 LAB — LIPID PANEL W/O CHOL/HDL RATIO
Cholesterol, Total: 172 mg/dL (ref 100–199)
HDL: 46 mg/dL (ref >39)
LDL Chol Calc (NIH): 110 mg/dL — ABNORMAL HIGH (ref 0–99)
Triglycerides: 84 mg/dL (ref 0–149)
VLDL Cholesterol Cal: 16 mg/dL (ref 5–40)

## 2023-04-05 LAB — COMPREHENSIVE METABOLIC PANEL
ALT: 15 [IU]/L (ref 0–32)
AST: 21 [IU]/L (ref 0–40)
Albumin: 4.6 g/dL (ref 3.9–4.9)
Alkaline Phosphatase: 142 [IU]/L — ABNORMAL HIGH (ref 44–121)
BUN/Creatinine Ratio: 17 (ref 12–28)
BUN: 38 mg/dL — ABNORMAL HIGH (ref 8–27)
Bilirubin Total: 0.5 mg/dL (ref 0.0–1.2)
CO2: 20 mmol/L (ref 20–29)
Calcium: 9.4 mg/dL (ref 8.7–10.3)
Chloride: 107 mmol/L — ABNORMAL HIGH (ref 96–106)
Creatinine, Ser: 2.28 mg/dL — ABNORMAL HIGH (ref 0.57–1.00)
Globulin, Total: 1.9 g/dL (ref 1.5–4.5)
Glucose: 111 mg/dL — ABNORMAL HIGH (ref 70–99)
Potassium: 4.6 mmol/L (ref 3.5–5.2)
Sodium: 140 mmol/L (ref 134–144)
Total Protein: 6.5 g/dL (ref 6.0–8.5)
eGFR: 23 mL/min/{1.73_m2} — ABNORMAL LOW (ref >59)

## 2023-04-05 LAB — TSH: TSH: 2.05 u[IU]/mL (ref 0.450–4.500)

## 2023-04-05 LAB — PARATHYROID HORMONE, INTACT (NO CA): PTH: 75 pg/mL — ABNORMAL HIGH (ref 15–65)

## 2023-04-05 NOTE — Progress Notes (Signed)
 Good morning, please let Irem know her labs have returned: - Kidney function continues to show stable Chronic Kidney Disease Stage 4 with no worsening.  And parathyroid  level remains a little elevated.  I do recommend you return to nephrology at [facility] when able.  If you need a new referral let me know.   - Lipid panel shows trend up in LDL, bad cholesterol, from your baseline, are you taking statin daily.  Please ensure you are taking Pravastatin  daily for stroke prevention and heart protection.  Any questions? Keep being amazing!!  Thank you for allowing me to participate in your care.  I appreciate you. Kindest regards, Gerhard Rappaport

## 2023-07-17 ENCOUNTER — Telehealth: Payer: Self-pay

## 2023-07-17 MED ORDER — CLOBETASOL PROPIONATE 0.05 % EX CREA: 1.0000 | TOPICAL_CREAM | Freq: Two times a day (BID) | CUTANEOUS | 4 refills | Status: AC

## 2023-07-17 MED ORDER — PREDNISONE 20 MG PO TABS: 40.0000 mg | ORAL_TABLET | Freq: Every day | ORAL | 0 refills | Status: AC

## 2023-07-17 NOTE — Telephone Encounter (Signed)
 Called and LVM letting patient know that Jolene sent in scripts for her.

## 2023-07-17 NOTE — Telephone Encounter (Signed)
 Copied from CRM [id number]. Topic: Clinical - Medication Question >> Jul 17, 2023  8:11 AM Lotus Round B wrote: Reason for CRM: pt called in because she said that she gets these rashes every once in a while and the doctor gave her a cream for it before and she was wondering if the doctor can send in another prescription for it . Also she stated that she usually goes to get a steroid shot but she doesn't have time to go to the clinic if there is a way a steriod can be sent to the pharmacy as well . If any questions to give the patient a call

## 2023-07-17 NOTE — Telephone Encounter (Signed)
 Routing to provider to advise. Historical meds showing a history of a Clobetasol  rx in the past.

## 2023-10-20 NOTE — Patient Instructions (Incomplete)
 Chronic Kidney Disease: Eating Plan Chronic kidney disease (CKD) is when your kidneys aren't working well. They can't remove waste, fluids, and other substances from your blood. When these substances build up, they can worsen kidney damage and affect your health. Eating certain foods can lead to a buildup of these substances. Changing your diet can help prevent more kidney damage. Diet changes may also delay dialysis or even keep you from needing it. What nutrients should I limit? Work with your health care team and an expert in healthy eating (dietitian) to make a meal plan that's right for you. Foods you can eat and foods you should limit or avoid will depend on: The stage of your kidney disease. Any other conditions you have. The items listed below are not a complete list. Talk with a dietitian to learn what's best for you. Potassium Potassium affects how well your heart beats. Too much potassium in your blood can cause an irregular heartbeat or even a heart attack. You may need to limit foods that are high in potassium, such as: Liquid milk and soy milk. Salt substitutes that contain potassium. Fruits like: Bananas. Apricots. Melon. Prunes and raisins. Kiwi. Nectarines and oranges. Vegetables, such as: Potatoes, sweet potatoes, and yams. Tomatoes. Leafy greens. Beets. Avocado. Pumpkin and winter squash. Beans, like lima beans. Nuts. Phosphorus Phosphorus is a mineral found in your bones. You need a balance between calcium and phosphorus to build and maintain healthy bones.  Too much added phosphorus from the foods you eat can pull calcium from your bones. Losing calcium can make your bones weak and more likely to break. Too much phosphorus can also make your skin itch. You may need to limit foods that are high in phosphorus or that have added phosphorus, such as: Liquid milk and dairy products. Dark-colored sodas or soft drinks. Bran cereals and oatmeal. Protein  Protein  helps your body make and keep muscle. Protein also helps to repair your body's cells and tissues.  One of the natural breakdown products of protein is a waste product called urea. When your kidneys aren't working well, they can't remove waste like urea. Reducing protein in your diet can help keep urea from building up in your blood. Depending on your stage of kidney disease, you may need to eat smaller portions of foods that are high in protein. Sources of animal protein include: Meat (all types). Fish and seafood. Poultry. Eggs. Dairy. Other protein foods include: Beans and legumes. Nuts and nut butter. Soy, like tofu.  Sodium Salt (sodium) helps to keep a healthy balance of fluids in your body. Too much salt can increase your blood pressure, which can harm your heart and lungs. Extra salt can also cause your body to keep too much fluid, making your kidneys work harder. You may need to limit or avoid foods that are high in salt, such as: Salt seasonings. Soy and teriyaki sauce. Meats that are: Packaged. Precooked. Cured. Processed. Salted crackers and snack foods. Fast food. Canned soups and foods. Pickled foods. Boxed mixes or ready-to-eat boxed meals and side dishes. Bottled dressings, sauces, and marinades. Talk with your dietitian about how much potassium, phosphorus, protein, and salt you may have each day. What are tips for following this plan? Reading food labels  Check the amount of salt in foods. Limit foods that have salt listed among the first five ingredients. Try to eat low-salt foods. Check the ingredient list for added phosphorus or potassium. "Phos" in an ingredient is a sign that  phosphorus has been added. Do not buy foods that are calcium-enriched or that have calcium added to them (fortified). Buy canned vegetables and beans that say "no salt added." Rinse them before eating. Lifestyle Limit the amount of protein you eat from animal sources each day. Focus on  protein from plant sources, like tofu and dried beans, peas, and lentils. Do not add salt to food when cooking or before eating. Do not eat star fruit. It can be toxic for people with kidney problems. Talk with your health care provider before taking any vitamin or mineral supplements. If told by your provider: Track how much liquid you have so you can avoid drinking too much. Try to eat foods that are made mostly from water, like gelatin, ice cream, soups, and juicy fruits and vegetables. If you have diabetes and chronic kidney disease: If you have diabetes and CKD, you need to keep your blood sugar (glucose) in the target range recommended by your provider. Follow your diabetes management plan. This may include: Checking your blood glucose regularly. Taking medicines by mouth, or taking insulin, or both. Exercising for at least 30 minutes on 5 or more days each week, or as told by your provider. Tracking how many servings of carbohydrates you eat at each meal. Not using orange juice to treat low blood sugars. Instead, use apple juice, cranberry juice, or clear soda. You may be given guidelines on what foods and nutrients you may eat, and how much you can have each day. This depends on your stage of kidney disease and whether you have high blood pressure. Follow the meal plan your dietitian gives you. Where to find more information [employer] of Diabetes and Digestive and Kidney Diseases: StageSync.si National Kidney Foundation: kidney.org This information is not intended to replace advice given to you by your health care provider. Make sure you discuss any questions you have with your health care provider. Document Revised: 10/22/2022 Document Reviewed: 10/22/2022 Elsevier Patient Education  2024 [employer].

## 2023-10-24 ENCOUNTER — Ambulatory Visit: Payer: Self-pay | Admitting: Nurse Practitioner

## 2023-10-24 DIAGNOSIS — N184 Chronic kidney disease, stage 4 (severe): Secondary | ICD-10-CM

## 2023-10-24 DIAGNOSIS — I1310 Hypertensive heart and chronic kidney disease without heart failure, with stage 1 through stage 4 chronic kidney disease, or unspecified chronic kidney disease: Secondary | ICD-10-CM

## 2023-10-24 DIAGNOSIS — E7800 Pure hypercholesterolemia, unspecified: Secondary | ICD-10-CM

## 2023-10-24 DIAGNOSIS — E6609 Other obesity due to excess calories: Secondary | ICD-10-CM

## 2023-12-07 NOTE — Patient Instructions (Incomplete)
 Chronic Kidney Disease: Eating Plan Chronic kidney disease (CKD) is when your kidneys aren't working well. They can't remove waste, fluids, and other substances from your blood. When these substances build up, they can worsen kidney damage and affect your health. Eating certain foods can lead to a buildup of these substances. Changing your diet can help prevent more kidney damage. Diet changes may also delay dialysis or even keep you from needing it. What nutrients should I limit? Work with your health care team and an expert in healthy eating (dietitian) to make a meal plan that's right for you. Foods you can eat and foods you should limit or avoid will depend on: The stage of your kidney disease. Any other conditions you have. The items listed below are not a complete list. Talk with a dietitian to learn what's best for you. Potassium Potassium affects how well your heart beats. Too much potassium in your blood can cause an irregular heartbeat or even a heart attack. You may need to limit foods that are high in potassium, such as: Liquid milk and soy milk. Salt substitutes that contain potassium. Fruits like: Bananas. Apricots. Melon. Prunes and raisins. Kiwi. Nectarines and oranges. Vegetables, such as: Potatoes, sweet potatoes, and yams. Tomatoes. Leafy greens. Beets. Avocado. Pumpkin and winter squash. Beans, like lima beans. Nuts. Phosphorus Phosphorus is a mineral found in your bones. You need a balance between calcium and phosphorus to build and maintain healthy bones.  Too much added phosphorus from the foods you eat can pull calcium from your bones. Losing calcium can make your bones weak and more likely to break. Too much phosphorus can also make your skin itch. You may need to limit foods that are high in phosphorus or that have added phosphorus, such as: Liquid milk and dairy products. Dark-colored sodas or soft drinks. Bran cereals and oatmeal. Protein  Protein  helps your body make and keep muscle. Protein also helps to repair your body's cells and tissues.  One of the natural breakdown products of protein is a waste product called urea. When your kidneys aren't working well, they can't remove waste like urea. Reducing protein in your diet can help keep urea from building up in your blood. Depending on your stage of kidney disease, you may need to eat smaller portions of foods that are high in protein. Sources of animal protein include: Meat (all types). Fish and seafood. Poultry. Eggs. Dairy. Other protein foods include: Beans and legumes. Nuts and nut butter. Soy, like tofu.  Sodium Salt (sodium) helps to keep a healthy balance of fluids in your body. Too much salt can increase your blood pressure, which can harm your heart and lungs. Extra salt can also cause your body to keep too much fluid, making your kidneys work harder. You may need to limit or avoid foods that are high in salt, such as: Salt seasonings. Soy and teriyaki sauce. Meats that are: Packaged. Precooked. Cured. Processed. Salted crackers and snack foods. Fast food. Canned soups and foods. Pickled foods. Boxed mixes or ready-to-eat boxed meals and side dishes. Bottled dressings, sauces, and marinades. Talk with your dietitian about how much potassium, phosphorus, protein, and salt you may have each day. What are tips for following this plan? Reading food labels  Check the amount of salt in foods. Limit foods that have salt listed among the first five ingredients. Try to eat low-salt foods. Check the ingredient list for added phosphorus or potassium. "Phos" in an ingredient is a sign that  phosphorus has been added. Do not buy foods that are calcium-enriched or that have calcium added to them (fortified). Buy canned vegetables and beans that say "no salt added." Rinse them before eating. Lifestyle Limit the amount of protein you eat from animal sources each day. Focus on  protein from plant sources, like tofu and dried beans, peas, and lentils. Do not add salt to food when cooking or before eating. Do not eat star fruit. It can be toxic for people with kidney problems. Talk with your health care provider before taking any vitamin or mineral supplements. If told by your provider: Track how much liquid you have so you can avoid drinking too much. Try to eat foods that are made mostly from water, like gelatin, ice cream, soups, and juicy fruits and vegetables. If you have diabetes and chronic kidney disease: If you have diabetes and CKD, you need to keep your blood sugar (glucose) in the target range recommended by your provider. Follow your diabetes management plan. This may include: Checking your blood glucose regularly. Taking medicines by mouth, or taking insulin, or both. Exercising for at least 30 minutes on 5 or more days each week, or as told by your provider. Tracking how many servings of carbohydrates you eat at each meal. Not using orange juice to treat low blood sugars. Instead, use apple juice, cranberry juice, or clear soda. You may be given guidelines on what foods and nutrients you may eat, and how much you can have each day. This depends on your stage of kidney disease and whether you have high blood pressure. Follow the meal plan your dietitian gives you. Where to find more information [employer] of Diabetes and Digestive and Kidney Diseases: StageSync.si National Kidney Foundation: kidney.org This information is not intended to replace advice given to you by your health care provider. Make sure you discuss any questions you have with your health care provider. Document Revised: 10/22/2022 Document Reviewed: 10/22/2022 Elsevier Patient Education  2024 [employer].

## 2023-12-12 ENCOUNTER — Ambulatory Visit: Payer: Self-pay | Admitting: Nurse Practitioner

## 2023-12-12 DIAGNOSIS — I1310 Hypertensive heart and chronic kidney disease without heart failure, with stage 1 through stage 4 chronic kidney disease, or unspecified chronic kidney disease: Secondary | ICD-10-CM

## 2023-12-12 DIAGNOSIS — E7800 Pure hypercholesterolemia, unspecified: Secondary | ICD-10-CM

## 2023-12-12 DIAGNOSIS — E66811 Obesity, class 1: Secondary | ICD-10-CM

## 2023-12-12 DIAGNOSIS — N184 Chronic kidney disease, stage 4 (severe): Secondary | ICD-10-CM

## 2024-01-23 ENCOUNTER — Encounter: Admitting: Nurse Practitioner

## 2024-04-07 ENCOUNTER — Other Ambulatory Visit: Payer: Self-pay | Admitting: Nurse Practitioner

## 2024-04-08 NOTE — Telephone Encounter (Signed)
 [facility] Refill Encounter  Last office visit: 04/03/24  Verified last visit within 6 months and next visit is scheduled: Yes   Next office visit: 04/30/2024   Patient record reviewed by CMA and medication due for refill: Yes  Requested Prescriptions   Pending Prescriptions Disp Refills   NIFEdipine  (PROCARDIA  XL/NIFEDICAL-XL) 90 MG 24 hr tablet [Pharmacy Med Name: NIFEDIPINE  ER 90 MG TABLET] 90 tablet 0    Sig: Take 1 tablet (90 mg total) by mouth daily.

## 2024-04-24 NOTE — Patient Instructions (Signed)
 Chronic Kidney Disease: Eating Plan Chronic kidney disease (CKD) is when your kidneys aren't working well. They can't remove waste, fluids, and other substances from your blood. When these substances build up, they can worsen kidney damage and affect your health. Eating certain foods can lead to a buildup of these substances. Changing your diet can help prevent more kidney damage. Diet changes may also delay dialysis or even keep you from needing it. What nutrients should I limit? Work with your health care team and an expert in healthy eating (dietitian) to make a meal plan that's right for you. Foods you can eat and foods you should limit or avoid will depend on: The stage of your kidney disease. Any other conditions you have. The items listed below are not a complete list. Talk with a dietitian to learn what's best for you. Potassium Potassium affects how well your heart beats. Too much potassium in your blood can cause an irregular heartbeat or even a heart attack. You may need to limit foods that are high in potassium, such as: Liquid milk and soy milk. Salt substitutes that contain potassium. Fruits like: Bananas. Apricots. Melon. Prunes and raisins. Kiwi. Nectarines and oranges. Vegetables, such as: Potatoes, sweet potatoes, and yams. Tomatoes. Leafy greens. Beets. Avocado. Pumpkin and winter squash. Beans, like lima beans. Nuts. Phosphorus Phosphorus is a mineral found in your bones. You need a balance between calcium and phosphorus to build and maintain healthy bones.  Too much added phosphorus from the foods you eat can pull calcium from your bones. Losing calcium can make your bones weak and more likely to break. Too much phosphorus can also make your skin itch. You may need to limit foods that are high in phosphorus or that have added phosphorus, such as: Liquid milk and dairy products. Dark-colored sodas or soft drinks. Bran cereals and oatmeal. Protein  Protein  helps your body make and keep muscle. Protein also helps to repair your body's cells and tissues.  One of the natural breakdown products of protein is a waste product called urea. When your kidneys aren't working well, they can't remove waste like urea. Reducing protein in your diet can help keep urea from building up in your blood. Depending on your stage of kidney disease, you may need to eat smaller portions of foods that are high in protein. Sources of animal protein include: Meat (all types). Fish and seafood. Poultry. Eggs. Dairy. Other protein foods include: Beans and legumes. Nuts and nut butter. Soy, like tofu.  Sodium Salt (sodium) helps to keep a healthy balance of fluids in your body. Too much salt can increase your blood pressure, which can harm your heart and lungs. Extra salt can also cause your body to keep too much fluid, making your kidneys work harder. You may need to limit or avoid foods that are high in salt, such as: Salt seasonings. Soy and teriyaki sauce. Meats that are: Packaged. Precooked. Cured. Processed. Salted crackers and snack foods. Fast food. Canned soups and foods. Pickled foods. Boxed mixes or ready-to-eat boxed meals and side dishes. Bottled dressings, sauces, and marinades. Talk with your dietitian about how much potassium, phosphorus, protein, and salt you may have each day. What are tips for following this plan? Reading food labels  Check the amount of salt in foods. Limit foods that have salt listed among the first five ingredients. Try to eat low-salt foods. Check the ingredient list for added phosphorus or potassium. "Phos" in an ingredient is a sign that  phosphorus has been added. Do not buy foods that are calcium-enriched or that have calcium added to them (fortified). Buy canned vegetables and beans that say "no salt added." Rinse them before eating. Lifestyle Limit the amount of protein you eat from animal sources each day. Focus on  protein from plant sources, like tofu and dried beans, peas, and lentils. Do not add salt to food when cooking or before eating. Do not eat star fruit. It can be toxic for people with kidney problems. Talk with your health care provider before taking any vitamin or mineral supplements. If told by your provider: Track how much liquid you have so you can avoid drinking too much. Try to eat foods that are made mostly from water, like gelatin, ice cream, soups, and juicy fruits and vegetables. If you have diabetes and chronic kidney disease: If you have diabetes and CKD, you need to keep your blood sugar (glucose) in the target range recommended by your provider. Follow your diabetes management plan. This may include: Checking your blood glucose regularly. Taking medicines by mouth, or taking insulin, or both. Exercising for at least 30 minutes on 5 or more days each week, or as told by your provider. Tracking how many servings of carbohydrates you eat at each meal. Not using orange juice to treat low blood sugars. Instead, use apple juice, cranberry juice, or clear soda. You may be given guidelines on what foods and nutrients you may eat, and how much you can have each day. This depends on your stage of kidney disease and whether you have high blood pressure. Follow the meal plan your dietitian gives you. Where to find more information [employer] of Diabetes and Digestive and Kidney Diseases: [employer]Sync.si National Kidney Foundation: kidney.org This information is not intended to replace advice given to you by your health care provider. Make sure you discuss any questions you have with your health care provider. Document Revised: 10/22/2022 Document Reviewed: 10/22/2022 Elsevier Patient Education  2024 [employer].

## 2024-04-29 ENCOUNTER — Other Ambulatory Visit: Payer: Self-pay | Admitting: Nurse Practitioner

## 2024-04-30 ENCOUNTER — Ambulatory Visit: Admitting: Nurse Practitioner

## 2024-04-30 ENCOUNTER — Encounter: Payer: Self-pay | Admitting: Nurse Practitioner

## 2024-04-30 VITALS — BP 118/68 | HR 61 | Temp 97.9°F | Ht 61.0 in | Wt 170.6 lb

## 2024-04-30 DIAGNOSIS — E7800 Pure hypercholesterolemia, unspecified: Secondary | ICD-10-CM

## 2024-04-30 DIAGNOSIS — E66811 Obesity, class 1: Secondary | ICD-10-CM

## 2024-04-30 DIAGNOSIS — I1310 Hypertensive heart and chronic kidney disease without heart failure, with stage 1 through stage 4 chronic kidney disease, or unspecified chronic kidney disease: Secondary | ICD-10-CM

## 2024-04-30 DIAGNOSIS — N184 Chronic kidney disease, stage 4 (severe): Secondary | ICD-10-CM

## 2024-04-30 LAB — MICROALBUMIN, URINE WAIVED
Creatinine, Urine Waived: 50 mg/dL (ref 10–300)
Microalb, Ur Waived: 150 mg/L — ABNORMAL HIGH (ref 0–19)
Microalb/Creat Ratio: >300 mg/g — ABNORMAL HIGH

## 2024-04-30 MED ORDER — METOPROLOL TARTRATE 100 MG PO TABS: 100.0000 mg | ORAL_TABLET | Freq: Two times a day (BID) | ORAL | 4 refills | Status: AC

## 2024-04-30 MED ORDER — SODIUM BICARBONATE 650 MG PO TABS: 650.0000 mg | ORAL_TABLET | Freq: Two times a day (BID) | ORAL | 4 refills | Status: AC

## 2024-04-30 MED ORDER — PRAVASTATIN SODIUM 40 MG PO TABS: 40.0000 mg | ORAL_TABLET | Freq: Every day | ORAL | 4 refills | Status: AC

## 2024-04-30 NOTE — Assessment & Plan Note (Signed)
Chronic, stable.  Continue current medication regimen and adjust as needed.  Obtain lipid panel today. ?

## 2024-04-30 NOTE — Progress Notes (Signed)
 "  BP 118/68 (BP Location: Left Arm, Patient Position: Sitting, Cuff Size: Normal)   Pulse 61   Temp 97.9 F (36.6 C) (Oral)   Ht 5' 1 (1.549 m)   Wt 170 lb 9.6 oz (77.4 kg)   LMP  (LMP Unknown)   SpO2 97%   BMI 32.23 kg/m    Subjective:    Patient ID: Emma Sullivan, female    DOB: 1960/11/27, 64 y.o.   MRN: [id number]  HPI: Emma Sullivan is a 64 y.o. female  Chief Complaint  Patient presents with   Chronic Kidney Disease   Hyperlipidemia   Hypertension   HYPERTENSION / HYPERLIPIDEMIA Taking Pravastatin  & Metoprolol  & Procardia  + Sodium Bicarb. Satisfied with current treatment? yes Duration of hypertension: chronic BP monitoring frequency: not checking BP range:  BP medication side effects: no Duration of hyperlipidemia: chronic Cholesterol medication side effects: no Cholesterol supplements: none Medication compliance: good compliance Aspirin: no Recent stressors: no Recurrent headaches: no Visual changes: no Palpitations: no Dyspnea: no Chest pain: no Lower extremity edema: no Dizzy/lightheaded: no  The 10-year ASCVD risk score (Arnett DK, et al., 2019) is: 7%   Values used to calculate the score:     Age: 37 years     Clinically relevant sex: Female     Is Non-Hispanic African American: No     Diabetic: No     Tobacco smoker: No     Systolic Blood Pressure: 118 mmHg     Is BP treated: Yes     HDL Cholesterol: 46 mg/dL     Total Cholesterol: 172 mg/dL  CHRONIC KIDNEY DISEASE (STAGE 4) Was following with [facility] nephrology, last seen 10/17/20. Has not returned due to cost and cannot afford specialist visit, but will return if worsening. Continues on sodium bicarbonate .  CKD status: stable Medications renally dose: yes Previous renal evaluation: yes Pneumovax: Refuses Influenza Vaccine:  Refuses     04/04/2023   11:46 AM 08/01/2022    8:16 AM 12/11/2021    8:12 AM 06/08/2021    9:07 AM 11/01/2020    3:02 PM  Depression screen PHQ 2/9   Decreased Interest 0 0 1 1 3   Down, Depressed, Hopeless 0 0 1 1 3   PHQ - 2 Score 0 0 2 2 6   Altered sleeping 0 0 1 1 1   Tired, decreased energy 0 0 1 1 1   Change in appetite 0 0 0 1 3  Feeling bad or failure about yourself  0 0 0 1 3  Trouble concentrating 0 0 0 1 1  Moving slowly or fidgety/restless 0 0 0 0 1  Suicidal thoughts 0 0 0 0 3  PHQ-9 Score 0  0  4  7  19    Difficult doing work/chores Not difficult at all Not difficult at all  Not difficult at all Not difficult at all     Data saved with a previous flowsheet row definition      04/04/2023   11:46 AM 08/01/2022    8:16 AM 06/08/2021    9:09 AM 11/01/2020    3:04 PM  GAD 7 : Generalized Anxiety Score  Nervous, Anxious, on Edge 0  0  1  1   Control/stop worrying 0  0  1  3   Worry too much - different things 0  0  1  3   Trouble relaxing 0  0  1  1   Restless 0  0  1  1  Easily annoyed or irritable 0  0  1  3   Afraid - awful might happen 0  0  1  1   Total GAD 7 Score 0 0 7 13  Anxiety Difficulty   Somewhat difficult Not difficult at all     Data saved with a previous flowsheet row definition   Relevant past medical, surgical, family and social history reviewed and updated as indicated. Interim medical history since our last visit reviewed. Allergies and medications reviewed and updated.  Review of Systems  Constitutional:  Negative for activity change, appetite change, diaphoresis, fatigue and fever.  Respiratory:  Negative for cough, chest tightness and shortness of breath.   Cardiovascular:  Negative for chest pain, palpitations and leg swelling.  Gastrointestinal:  Negative for abdominal distention, abdominal pain, constipation, diarrhea, nausea and vomiting.  Endocrine: Negative for cold intolerance and heat intolerance.  Neurological:  Negative for dizziness, syncope, weakness, light-headedness, numbness and headaches.  Psychiatric/Behavioral: Negative.      Per HPI unless specifically indicated above      Objective:    BP 118/68 (BP Location: Left Arm, Patient Position: Sitting, Cuff Size: Normal)   Pulse 61   Temp 97.9 F (36.6 C) (Oral)   Ht 5' 1 (1.549 m)   Wt 170 lb 9.6 oz (77.4 kg)   LMP  (LMP Unknown)   SpO2 97%   BMI 32.23 kg/m   Wt Readings from Last 3 Encounters:  04/30/24 170 lb 9.6 oz (77.4 kg)  04/04/23 165 lb 9.6 oz (75.1 kg)  08/01/22 161 lb 6.4 oz (73.2 kg)    Physical Exam Vitals and nursing note reviewed.  Constitutional:      General: She is awake. She is not in acute distress.    Appearance: She is well-developed and well-groomed. She is obese. She is not ill-appearing or toxic-appearing.  HENT:     Head: Normocephalic.     Right Ear: Hearing normal.     Left Ear: Hearing normal.  Eyes:     General: Lids are normal.        Right eye: No discharge.        Left eye: No discharge.     Conjunctiva/sclera: Conjunctivae normal.     Pupils: Pupils are equal, round, and reactive to light.  Neck:     Thyroid : No thyromegaly.     Vascular: No carotid bruit.  Cardiovascular:     Rate and Rhythm: Normal rate and regular rhythm.     Heart sounds: Normal heart sounds. No murmur heard.    No gallop.  Pulmonary:     Effort: Pulmonary effort is normal. No accessory muscle usage or respiratory distress.     Breath sounds: No decreased breath sounds, wheezing or rales.  Abdominal:     General: Bowel sounds are normal.     Palpations: Abdomen is soft. There is no hepatomegaly or splenomegaly.  Musculoskeletal:     Cervical back: Normal range of motion and neck supple.     Right lower leg: No edema.     Left lower leg: No edema.  Skin:    General: Skin is warm and dry.  Neurological:     Mental Status: She is alert and oriented to person, place, and time.  Psychiatric:        Attention and Perception: Attention normal.        Mood and Affect: Mood normal.        Behavior: Behavior normal. Behavior is cooperative.  Thought Content: Thought content normal.         Judgment: Judgment normal.    Results for orders placed or performed in visit on 04/04/23  CBC with Differential/Platelet   Collection Time: 04/04/23 11:26 AM  Result Value Ref Range   WBC 8.8 3.4 - 10.8 x10E3/uL   RBC 5.09 3.77 - 5.28 x10E6/uL   Hemoglobin 14.8 11.1 - 15.9 g/dL   Hematocrit 55.3 65.9 - 46.6 %   MCV 88 79 - 97 fL   MCH 29.1 26.6 - 33.0 pg   MCHC 33.2 31.5 - 35.7 g/dL   RDW 86.8 88.2 - 84.5 %   Platelets 220 150 - 450 x10E3/uL   Neutrophils 70 Not Estab. %   Lymphs 21 Not Estab. %   Monocytes 5 Not Estab. %   Eos 3 Not Estab. %   Basos 1 Not Estab. %   Neutrophils Absolute 6.1 1.4 - 7.0 x10E3/uL   Lymphocytes Absolute 1.9 0.7 - 3.1 x10E3/uL   Monocytes Absolute 0.4 0.1 - 0.9 x10E3/uL   EOS (ABSOLUTE) 0.2 0.0 - 0.4 x10E3/uL   Basophils Absolute 0.1 0.0 - 0.2 x10E3/uL   Immature Granulocytes 0 Not Estab. %   Immature Grans (Abs) 0.0 0.0 - 0.1 x10E3/uL  Comprehensive metabolic panel   Collection Time: 04/04/23 11:26 AM  Result Value Ref Range   Glucose 111 (H) 70 - 99 mg/dL   BUN 38 (H) 8 - 27 mg/dL   Creatinine, Ser 7.71 (H) 0.57 - 1.00 mg/dL   eGFR 23 (L) >40 fO/fpw/8.26   BUN/Creatinine Ratio 17 12 - 28   Sodium 140 134 - 144 mmol/L   Potassium 4.6 3.5 - 5.2 mmol/L   Chloride 107 (H) 96 - 106 mmol/L   CO2 20 20 - 29 mmol/L   Calcium 9.4 8.7 - 10.3 mg/dL   Total Protein 6.5 6.0 - 8.5 g/dL   Albumin 4.6 3.9 - 4.9 g/dL   Globulin, Total 1.9 1.5 - 4.5 g/dL   Bilirubin Total 0.5 0.0 - 1.2 mg/dL   Alkaline Phosphatase 142 (H) 44 - 121 IU/L   AST 21 0 - 40 IU/L   ALT 15 0 - 32 IU/L  TSH   Collection Time: 04/04/23 11:26 AM  Result Value Ref Range   TSH 2.050 0.450 - 4.500 uIU/mL  Lipid Panel w/o Chol/HDL Ratio   Collection Time: 04/04/23 11:26 AM  Result Value Ref Range   Cholesterol, Total 172 100 - 199 mg/dL   Triglycerides 84 0 - 149 mg/dL   HDL 46 >60 mg/dL   VLDL Cholesterol Cal 16 5 - 40 mg/dL   LDL Chol Calc (NIH) 889 (H) 0 - 99 mg/dL   Parathyroid  hormone, intact (no Ca)   Collection Time: 04/04/23 11:26 AM  Result Value Ref Range   PTH 75 (H) 15 - 65 pg/mL      Assessment & Plan:   Problem List Items Addressed This Visit       Cardiovascular and Mediastinum   Hypertensive heart and kidney disease without HF and with CKD stage IV (HCC)   Chronic, stable. BP at goal in office today. Continue current medication regimen and adjust as needed.  Has not been back to see [facility] nephrology due to cost, recommend she return for visit when able -- will monitor here at this time.  Recommend she check BP at home at least 3 mornings a week and document for provider + focus on DASH diet.  LABS: CBC, CMP, TSH,  urine ALB. Urine ALB February 2026.      Relevant Medications   pravastatin  (PRAVACHOL ) 40 MG tablet   metoprolol  tartrate (LOPRESSOR ) 100 MG tablet   Other Relevant Orders   CBC with Differential/Platelet   Comprehensive metabolic panel with GFR   TSH     Genitourinary   Chronic kidney disease, stage 4 (severe) (HCC) - Primary   Followed by [facility] nephrology in past at [facility], has not returned as unable to afford specialists at this time, recommend she return to visit or change to local nephrology group -- refuses referral at this time.  Will obtain all labs today, she agrees with this.  If worsening labs will get her in sooner to nephrology group of her preference. She agrees with this plan. Refills sent in. Urine ALB 150 February 2026.      Relevant Orders   Microalbumin, Urine Waived   Parathyroid  hormone, intact (no Ca)   CBC with Differential/Platelet   Comprehensive metabolic panel with GFR     Other   Obesity   BMI 32.33.  Recommended eating smaller high protein, low fat meals more frequently and exercising 30 mins a day 5 times a week with a goal of 10-15lb weight loss in the next 3 months. Patient voiced their understanding and motivation to adhere to these recommendations.       Relevant Medications   sodium  bicarbonate 650 MG tablet   Hyperlipemia   Chronic, stable.  Continue current medication regimen and adjust as needed.  Obtain lipid panel today.      Relevant Medications   pravastatin  (PRAVACHOL ) 40 MG tablet   metoprolol  tartrate (LOPRESSOR ) 100 MG tablet   Other Relevant Orders   Comprehensive metabolic panel with GFR   Lipid Panel w/o Chol/HDL Ratio     Follow up plan: Return in about 1 year (around 04/30/2025) for HTN/HLD, CKD -- agrees to return in one year, her preference - will come sooner if needed. "

## 2024-04-30 NOTE — Telephone Encounter (Signed)
 Requested Prescriptions  Refused Prescriptions Disp Refills   pravastatin  (PRAVACHOL ) 40 MG tablet [Pharmacy Med Name: PRAVASTATIN  SODIUM 40 MG TAB] 90 tablet 0    Sig: Take 1 tablet (40 mg total) by mouth daily.     Cardiovascular:  Antilipid - Statins Failed - 04/30/2024  2:14 PM      Failed - Lipid Panel in normal range within the last 12 months    Cholesterol, Total  Date Value Ref Range Status  04/04/2023 172 100 - 199 mg/dL Final   LDL Chol Calc (NIH)  Date Value Ref Range Status  04/04/2023 110 (H) 0 - 99 mg/dL Final   HDL  Date Value Ref Range Status  04/04/2023 46 >39 mg/dL Final   Triglycerides  Date Value Ref Range Status  04/04/2023 84 0 - 149 mg/dL Final         Passed - Patient is not pregnant      Passed - Valid encounter within last 12 months    Recent Outpatient Visits           Today Chronic kidney disease, stage 4 (severe) (HCC)   [facility], Melanie DASEN, NP

## 2024-04-30 NOTE — Assessment & Plan Note (Signed)
 Followed by [facility] nephrology in past at [facility], has not returned as unable to afford specialists at this time, recommend she return to visit or change to local nephrology group -- refuses referral at this time.  Will obtain all labs today, she agrees with this.  If worsening labs will get her in sooner to nephrology group of her preference. She agrees with this plan. Refills sent in. Urine ALB 150 February 2026.

## 2024-04-30 NOTE — Assessment & Plan Note (Signed)
 Chronic, stable. BP at goal in office today. Continue current medication regimen and adjust as needed.  Has not been back to see [facility] nephrology due to cost, recommend she return for visit when able -- will monitor here at this time.  Recommend she check BP at home at least 3 mornings a week and document for provider + focus on DASH diet.  LABS: CBC, CMP, TSH, urine ALB. Urine ALB February 2026.

## 2024-04-30 NOTE — Assessment & Plan Note (Signed)
BMI 32.33.  Recommended eating smaller high protein, low fat meals more frequently and exercising 30 mins a day 5 times a week with a goal of 10-15lb weight loss in the next 3 months. Patient voiced their understanding and motivation to adhere to these recommendations.  

## 2025-05-06 ENCOUNTER — Ambulatory Visit: Admitting: Nurse Practitioner
